# Patient Record
Sex: Female | Born: 1980 | Race: White | Hispanic: No | Marital: Married | Smoking: Never smoker
Health system: Southern US, Community
[De-identification: ages and names within clinical notes are randomized; demographics above are authoritative.]

## PROBLEM LIST (undated history)

## (undated) DIAGNOSIS — L509 Urticaria, unspecified: Secondary | ICD-10-CM

## (undated) DIAGNOSIS — R400 Somnolence: Secondary | ICD-10-CM

## (undated) DIAGNOSIS — F419 Anxiety disorder, unspecified: Secondary | ICD-10-CM

## (undated) DIAGNOSIS — F4310 Post-traumatic stress disorder, unspecified: Secondary | ICD-10-CM

## (undated) DIAGNOSIS — F329 Major depressive disorder, single episode, unspecified: Secondary | ICD-10-CM

## (undated) DIAGNOSIS — S82899A Other fracture of unspecified lower leg, initial encounter for closed fracture: Secondary | ICD-10-CM

## (undated) DIAGNOSIS — G43909 Migraine, unspecified, not intractable, without status migrainosus: Secondary | ICD-10-CM

## (undated) DIAGNOSIS — E119 Type 2 diabetes mellitus without complications: Secondary | ICD-10-CM

## (undated) DIAGNOSIS — F32A Depression, unspecified: Secondary | ICD-10-CM

## (undated) DIAGNOSIS — L309 Dermatitis, unspecified: Secondary | ICD-10-CM

## (undated) HISTORY — DX: Urticaria, unspecified: L50.9

## (undated) HISTORY — PX: CHOLECYSTECTOMY: SHX55

## (undated) HISTORY — PX: HERNIA REPAIR: SHX51

## (undated) HISTORY — PX: GANGLION CYST EXCISION: SHX1691

## (undated) HISTORY — DX: Somnolence: R40.0

---
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
## 2000-07-18 ENCOUNTER — Other Ambulatory Visit: Admission: RE | Admit: 2000-07-18 | Discharge: 2000-07-18 | Payer: Self-pay | Admitting: *Deleted

## 2001-07-27 ENCOUNTER — Other Ambulatory Visit: Admission: RE | Admit: 2001-07-27 | Discharge: 2001-07-27 | Payer: Self-pay | Admitting: Obstetrics and Gynecology

## 2002-08-31 ENCOUNTER — Other Ambulatory Visit: Admission: RE | Admit: 2002-08-31 | Discharge: 2002-08-31 | Payer: Self-pay | Admitting: Obstetrics and Gynecology

## 2005-06-19 ENCOUNTER — Emergency Department (HOSPITAL_COMMUNITY): Admission: EM | Admit: 2005-06-19 | Discharge: 2005-06-19 | Payer: Self-pay | Admitting: Emergency Medicine

## 2006-01-19 ENCOUNTER — Encounter (INDEPENDENT_AMBULATORY_CARE_PROVIDER_SITE_OTHER): Payer: Self-pay | Admitting: *Deleted

## 2006-01-19 ENCOUNTER — Observation Stay (HOSPITAL_COMMUNITY): Admission: EM | Admit: 2006-01-19 | Discharge: 2006-01-20 | Payer: Self-pay | Admitting: Emergency Medicine

## 2006-09-25 ENCOUNTER — Encounter: Admission: RE | Admit: 2006-09-25 | Discharge: 2006-09-25 | Payer: Self-pay | Admitting: Certified Nurse Midwife

## 2006-10-28 ENCOUNTER — Inpatient Hospital Stay (HOSPITAL_COMMUNITY): Admission: AD | Admit: 2006-10-28 | Discharge: 2006-10-28 | Payer: Self-pay | Admitting: Certified Nurse Midwife

## 2006-10-30 ENCOUNTER — Inpatient Hospital Stay (HOSPITAL_COMMUNITY): Admission: AD | Admit: 2006-10-30 | Discharge: 2006-10-31 | Payer: Self-pay | Admitting: Obstetrics & Gynecology

## 2006-11-25 ENCOUNTER — Inpatient Hospital Stay (HOSPITAL_COMMUNITY): Admission: AD | Admit: 2006-11-25 | Discharge: 2006-11-28 | Payer: Self-pay | Admitting: Obstetrics & Gynecology

## 2007-11-04 ENCOUNTER — Ambulatory Visit: Payer: Self-pay | Admitting: Gastroenterology

## 2007-11-04 LAB — CONVERTED CEMR LAB
ALT: 24 units/L (ref 0–35)
Albumin: 4 g/dL (ref 3.5–5.2)
Basophils Relative: 0.5 % (ref 0.0–1.0)
CO2: 25 meq/L (ref 19–32)
Calcium: 9.5 mg/dL (ref 8.4–10.5)
GFR calc non Af Amer: 108 mL/min
Glucose, Bld: 98 mg/dL (ref 70–99)
IgA: 234 mg/dL (ref 68–378)
Monocytes Relative: 6.2 % (ref 3.0–11.0)
Neutrophils Relative %: 63 % (ref 43.0–77.0)
Potassium: 4.3 meq/L (ref 3.5–5.1)
RDW: 12.5 % (ref 11.5–14.6)
TSH: 2.5 microintl units/mL (ref 0.35–5.50)
Tissue Transglutaminase Ab, IgA: 0.5 units (ref <7)
WBC: 9.1 10*3/uL (ref 4.5–10.5)

## 2008-09-14 ENCOUNTER — Emergency Department (HOSPITAL_COMMUNITY): Admission: EM | Admit: 2008-09-14 | Discharge: 2008-09-14 | Payer: Self-pay | Admitting: Emergency Medicine

## 2009-09-20 ENCOUNTER — Inpatient Hospital Stay (HOSPITAL_COMMUNITY): Admission: EM | Admit: 2009-09-20 | Discharge: 2009-09-22 | Payer: Self-pay | Admitting: Emergency Medicine

## 2010-01-12 ENCOUNTER — Inpatient Hospital Stay (HOSPITAL_COMMUNITY): Admission: AD | Admit: 2010-01-12 | Discharge: 2010-01-12 | Payer: Self-pay | Admitting: Obstetrics

## 2010-04-23 ENCOUNTER — Inpatient Hospital Stay (HOSPITAL_COMMUNITY): Admission: AD | Admit: 2010-04-23 | Discharge: 2010-04-23 | Payer: Self-pay | Admitting: Obstetrics & Gynecology

## 2010-04-23 DIAGNOSIS — O4700 False labor before 37 completed weeks of gestation, unspecified trimester: Secondary | ICD-10-CM

## 2010-05-18 ENCOUNTER — Inpatient Hospital Stay (HOSPITAL_COMMUNITY): Admission: AD | Admit: 2010-05-18 | Discharge: 2010-05-18 | Payer: Self-pay | Admitting: Obstetrics & Gynecology

## 2010-05-18 DIAGNOSIS — O4700 False labor before 37 completed weeks of gestation, unspecified trimester: Secondary | ICD-10-CM

## 2010-05-27 ENCOUNTER — Inpatient Hospital Stay (HOSPITAL_COMMUNITY): Admission: AD | Admit: 2010-05-27 | Discharge: 2010-05-27 | Payer: Self-pay | Admitting: Obstetrics & Gynecology

## 2010-06-07 ENCOUNTER — Inpatient Hospital Stay (HOSPITAL_COMMUNITY): Admission: AD | Admit: 2010-06-07 | Discharge: 2010-06-09 | Payer: Self-pay | Admitting: Obstetrics & Gynecology

## 2010-06-09 ENCOUNTER — Encounter: Admission: RE | Admit: 2010-06-09 | Discharge: 2010-06-13 | Payer: Self-pay | Admitting: Certified Nurse Midwife

## 2010-06-13 ENCOUNTER — Ambulatory Visit: Admission: RE | Admit: 2010-06-13 | Discharge: 2010-06-13 | Payer: Self-pay | Admitting: Certified Nurse Midwife

## 2010-12-06 LAB — CBC
HCT: 35.3 % — ABNORMAL LOW (ref 36.0–46.0)
Hemoglobin: 12.2 g/dL (ref 12.0–15.0)
Hemoglobin: 13.6 g/dL (ref 12.0–15.0)
MCH: 30.6 pg (ref 26.0–34.0)
MCHC: 34.5 g/dL (ref 30.0–36.0)
MCV: 87.6 fL (ref 78.0–100.0)
MCV: 88.6 fL (ref 78.0–100.0)
RBC: 3.98 MIL/uL (ref 3.87–5.11)
RDW: 13 % (ref 11.5–15.5)
WBC: 12.1 10*3/uL — ABNORMAL HIGH (ref 4.0–10.5)

## 2010-12-07 LAB — URINE MICROSCOPIC-ADD ON

## 2010-12-07 LAB — GLUCOSE, CAPILLARY: Glucose-Capillary: 107 mg/dL — ABNORMAL HIGH (ref 70–99)

## 2010-12-07 LAB — URINALYSIS, ROUTINE W REFLEX MICROSCOPIC
Bilirubin Urine: NEGATIVE
Glucose, UA: >1000 mg/dL — AB
Hgb urine dipstick: NEGATIVE
Ketones, ur: NEGATIVE mg/dL
Leukocytes, UA: NEGATIVE
Nitrite: NEGATIVE
Protein, ur: NEGATIVE mg/dL
Specific Gravity, Urine: >1.03 — ABNORMAL HIGH (ref 1.005–1.030)
Urobilinogen, UA: 0.2 mg/dL (ref 0.0–1.0)
pH: 6 (ref 5.0–8.0)

## 2010-12-07 LAB — CBC
MCV: 88.2 fL (ref 78.0–100.0)
WBC: 10.3 10*3/uL (ref 4.0–10.5)

## 2010-12-11 LAB — URINALYSIS, ROUTINE W REFLEX MICROSCOPIC
Glucose, UA: 250 mg/dL — AB
Ketones, ur: 15 mg/dL — AB
Nitrite: NEGATIVE
Protein, ur: NEGATIVE mg/dL
Specific Gravity, Urine: >1.03 — ABNORMAL HIGH (ref 1.005–1.030)
pH: 6 (ref 5.0–8.0)

## 2010-12-11 LAB — URINE MICROSCOPIC-ADD ON

## 2010-12-24 LAB — COMPREHENSIVE METABOLIC PANEL
AST: 34 U/L (ref 0–37)
Alkaline Phosphatase: 65 U/L (ref 39–117)
BUN: 9 mg/dL (ref 6–23)
CO2: 25 mEq/L (ref 19–32)
Calcium: 9.3 mg/dL (ref 8.4–10.5)
Creatinine, Ser: 0.72 mg/dL (ref 0.4–1.2)
GFR calc Af Amer: >60 mL/min (ref >60)
Glucose, Bld: 89 mg/dL (ref 70–99)
Potassium: 4.1 mEq/L (ref 3.5–5.1)
Sodium: 139 mEq/L (ref 135–145)

## 2010-12-24 LAB — URINALYSIS, ROUTINE W REFLEX MICROSCOPIC
Glucose, UA: NEGATIVE mg/dL
Hgb urine dipstick: NEGATIVE
pH: 6.5 (ref 5.0–8.0)

## 2010-12-24 LAB — DIFFERENTIAL
Basophils Absolute: 0 10*3/uL (ref 0.0–0.1)
Basophils Relative: 1 % (ref 0–1)
Eosinophils Absolute: 0.1 10*3/uL (ref 0.0–0.7)
Eosinophils Relative: 2 % (ref 0–5)
Monocytes Absolute: 0.6 10*3/uL (ref 0.1–1.0)

## 2010-12-24 LAB — POCT PREGNANCY, URINE: Preg Test, Ur: NEGATIVE

## 2010-12-24 LAB — CBC
HCT: 44 % (ref 36.0–46.0)
Platelets: 207 10*3/uL (ref 150–400)
RDW: 12.5 % (ref 11.5–15.5)
WBC: 8.9 10*3/uL (ref 4.0–10.5)

## 2011-01-16 ENCOUNTER — Ambulatory Visit (INDEPENDENT_AMBULATORY_CARE_PROVIDER_SITE_OTHER): Payer: BC Managed Care – PPO

## 2011-01-16 ENCOUNTER — Inpatient Hospital Stay (INDEPENDENT_AMBULATORY_CARE_PROVIDER_SITE_OTHER)
Admission: RE | Admit: 2011-01-16 | Discharge: 2011-01-16 | Disposition: A | Discharge status: Home / Self Care | Payer: BC Managed Care – PPO | Source: Ambulatory Visit | Attending: Family Medicine | Admitting: Family Medicine

## 2011-01-16 DIAGNOSIS — M549 Dorsalgia, unspecified: Secondary | ICD-10-CM

## 2011-01-23 ENCOUNTER — Other Ambulatory Visit: Payer: Self-pay | Admitting: Internal Medicine

## 2011-01-25 ENCOUNTER — Ambulatory Visit
Admission: RE | Admit: 2011-01-25 | Discharge: 2011-01-25 | Disposition: A | Discharge status: Home / Self Care | Payer: BC Managed Care – PPO | Source: Ambulatory Visit | Attending: Internal Medicine | Admitting: Internal Medicine

## 2011-02-05 NOTE — Assessment & Plan Note (Signed)
[facility]                         GASTROENTEROLOGY OFFICE NOTE   JOVEE, DETTINGER                    MRN:          [id number]  DATE:11/04/2007                            DOB:          Oct 18, 1980    REASON FOR CONSULTATION:  Abdominal pain, diarrhea, and hematochezia.   Ms. Calia is a 30 year old white female who is here today with her  mother.  She relates intermittent pain located at the site of an  abdominal wall hernia.  The hernia appears to be easily reducible.  She  also relates intermittent nausea, lower abdominal pain, and diarrhea  that have been problems for several years.  She has noted several  episodes of small amounts of bright red blood per rectum recently.  She  tried a high-fiber diet for the past 2 months which has improved her  symptoms.  She relates no change in stool caliber, weight loss, or  recent antibiotic usage.  She has a cousin with Crohn's disease and a  father with a history of alcoholism and cirrhosis.  No family members  with colon cancer or colon polyps.   CURRENT MEDICATIONS:  Listed on the chart, updated and reviewed.   MEDICATION ALLERGIES:  Listed on the chart, updated and reviewed.   SOCIAL HISTORY/REVIEW OF SYSTEMS:  Per the handwritten form.   PAST MEDICAL HISTORY:  Status post laparoscopic cholecystectomy with  intraoperative cholangiogram   PHYSICAL EXAMINATION:  Obese white female in no acute distress.  Height 5 feet 3-1/2 inches, weight 249 pounds, blood pressure is 126/80,  pulse 80 and regular.  HEENT EXAM:  Anicteric sclerae, oropharynx clear.  CHEST:  Clear to auscultation bilaterally.  CARDIAC:  Regular rate and rhythm without murmurs appreciated.  ABDOMEN:  Large, soft, and nondistended.  No palpable organomegaly or  masses.  She has a small umbilical hernia that is easily reducible and  is slightly tender.  NABS.  EXTREMITIES:  Without cyanosis, clubbing, or edema.  NEUROLOGIC:   Alert and oriented x3, grossly nonfocal.   ASSESSMENT AND PLAN:  1. Chronic, intermittent diarrhea, lower abdominal pain and small      volume hematochezia.  Her symptoms have improved on a high-fiber      diet.  Rule out irritable bowel syndrome, post cholecystectomy      diarrhea, inflammatory bowel disease, functional diarrhea,      hemorrhoids and other disorders.  Obtain a CBC, CMET, TSH, and      tissue transglutaminase today.  I have recommended proceeding with      a colonoscopy, but she appears very reluctant to schedule this.      Her mother states that she has been recommended to undergo      colonoscopy in the past but has declined.  At this time she is      unsure whether she will proceed, and she states she will call back      if she would like to proceed.  I have advised her to maintain her      high-fiber diet, and maintain adequate water intake on a daily      basis.  2. Small, easily reducible, umbilical hernia.  She will return to see      Dr. Fanny Skates for further management.     Pricilla Riffle. Fuller Plan, MD, [facility]  Electronically Signed    MTS/MedQ  DD: 11/10/2007  DT: 11/11/2007  Job #: [id number]   cc:   Edsel Petrin. Dalbert Batman, M.D.

## 2011-02-08 NOTE — Op Note (Signed)
NAME:  Kellie Fisher, Kellie Fisher       ACCOUNT NO.:  [id number]   MEDICAL RECORD NO.:  [id number]          PATIENT TYPE:  EMS   LOCATION:  ED                           FACILITY:  [facility]   PHYSICIAN:  Edsel Petrin. Dalbert Batman, M.D.DATE OF BIRTH:  [birth year]   DATE OF PROCEDURE:  01/19/2006  DATE OF DISCHARGE:                                 OPERATIVE REPORT   PREOP DIAGNOSIS:  Acute cholecystitis with cholelithiasis.   POSTOPERATIVE DIAGNOSIS:  Subacute cholecystitis with cholelithiasis.   OPERATION PERFORMED:  Laparoscopic cholecystectomy with intraoperative  cholangiogram.   SURGEON:  Edsel Petrin. Dalbert Batman, M.D.   ASSISTANT:  Jonne Ply, MD   OPERATIVE INDICATIONS:  This is a 30 year old morbidly obese white female.  She presents with a 1-week history of intermittent episodes of upper  abdominal pain and postprandial nausea.  When she was seen in emergency room  last night, she had had continuous pain for 24 hours.  She had mild  abdominal tenderness.  Lab work showed white blood cell count 14,400, normal  liver function test and normal lipase.  The gallbladder ultrasound showed  gallstones, slight wall thickening and a positive sonographic Murphy's sign  and normal common bile duct.  She was admitted at midnight last night and is  brought to operating room this morning for cholecystectomy.   OPERATIVE FINDINGS:  The gallbladder was a little bit thin walled and  edematous but was not severely inflamed.  There is no evidence of gangrene  or exudate.  It was slightly thick-walled however.  The liver was large  consistent with fatty infiltration.  It was quite heavy.  Otherwise the  stomach, duodenum, small intestine, large intestine and peritoneal surfaces  looked normal.   OPERATIVE TECHNIQUE:  Following induction of general endotracheal  anesthesia, the patient's abdomen was prepped and draped in sterile fashion.  Intravenous antibiotics were given in the holding area immediately  preop.  PAS hose were in place.  The patient was identified.  0.5% Marcaine with  epinephrine was used as a local infiltration anesthetic.  A vertically  oriented incision was made at the lower rim of the umbilicus.  The fascia  was incised in the midline and the abdominal cavity entered under direct  vision.  A 10 mm Hassan trocar was inserted and secured pursestring suture  of 0 Vicryl.  Pneumoperitoneum was created.  The video cam was inserted with  visualization and findings as described above.  A 10-mm trocars placed in  the subxiphoid region and two 5 mm trocars placed in the right upper  quadrant.  The gallbladder was tense and was evacuated with suction trocar.  We dissected out the cystic duct and cystic artery.  We created a large  window behind the cystic duct.  Metal clip was placed on the cystic duct  close to the gallbladder.  Cholangiogram catheter was inserted into the  cystic duct.  A cholangiogram was obtained using the C-arm.  The  cholangiogram showed normal intrahepatic and extrahepatic bile duct anatomy,  no filling defects, and no obstruction with good flow of contrast into the  duodenum.  The cholangiogram catheter was removed and the  cystic duct was  secured with multiple metal clips and divided.  Cystic artery was then  isolated as it went onto the wall of the gallbladder, was secured with  multiple metal clips and divided.  Gallbladder dissected from its bed with  electrocautery, placed in the specimen bag and removed.  The operative field  was copiously irrigated with saline.  At the completion of the case,  irrigation fluid was completely clear.  There is no bleeding or bile leak  whatsoever.  The trocars were removed under direct vision.  There is no  bleeding from trocar sites.  Pneumoperitoneum was released.  The fascia at  the umbilicus closed with 0 Vicryl  sutures.  The skin incision closed subcuticular sutures of 4-0 Monocryl and  Steri-Strips.  Clean  bandages were placed.  The patient taken to recovery in  stable condition.  Estimated blood loss about 10 mL.  Complications none.  Sponge, needle and instrument counts were correct.      Edsel Petrin. Dalbert Batman, M.D.  Electronically Signed     HMI/MEDQ  D:  01/19/2006  T:  01/20/2006  Job:  [id number]   cc:   [facility], [address]

## 2011-02-08 NOTE — H&P (Signed)
NAME:  Kellie Fisher, Kellie Fisher       ACCOUNT NO.:  [id number]   MEDICAL RECORD NO.:  [id number]          PATIENT TYPE:  EMS   LOCATION:  ED                           FACILITY:  [facility]   PHYSICIAN:  Edsel Petrin. Dalbert Batman, M.D.DATE OF BIRTH:  [birth year]   DATE OF ADMISSION:  01/18/2006  DATE OF DISCHARGE:                                HISTORY & PHYSICAL   CHIEF COMPLAINT:  Abdominal pain and nausea.   HISTORY OF PRESENT ILLNESS:  This is a 30 year old white female who reports  a one-week history of intermittent episodes of upper abdominal pain and  postprandial nausea.  The pain has been worse and has been continuous for  the past 24 hours.  She states that she went to [facility] today  and was referred to the [facility] Emergency Room.  When she was at [facility], her initial blood pressure was 88/40 but after receiving some  IV fluids, it has been normal.  The patient was evaluated in the [facility]  Emergency Room by Dr. Peter Minium and after a thorough work-up, I was asked to  see the patient.  I was called to see the patient at 10:30 p.m. She has been  stable but still has pain despite the use of intravenous Dilaudid.   PAST MEDICAL HISTORY:  1.  Surgery for the left wrist ganglion.  2.  Excision of lipomas of the extremities.  3.  She has had fractures of her left wrist, fractures of her left elbow and      fractures of her right ankle.  4.  She has an anxiety disorder.  5.  She has obesity.   CURRENT MEDICATIONS:  1.  Xanax p.r.n.  2.  Lexapro daily.  3.  Zantac p.r.n.   ALLERGIES:  CLINDAMYCIN, BENZOIN, PEROXIDE and perhaps some other ACNE  MEDICATIONS.   SOCIAL HISTORY:  The patient is married but may be a estranged from her  husband.  She is here with a female partner who is not her husband.  She has  never been pregnant.  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  Denies the use of tobacco.  Drinks alcohol rarely.   FAMILY HISTORY:   Mother living, has obesity and lung disease.  Father  deceased. Had alcoholic liver disease.  The patient is an only child.  There  is polycystic kidney disease and diabetes in the family.   REVIEW OF SYSTEMS:  All 15-system review of systems is performed.  It is  noncontributory except as described above.   PHYSICAL EXAMINATION:  GENERAL:  Obese, white female in minimal distress.  She is slightly sedated from the Dilaudid, but is cooperative.  VITAL SIGNS:  Temperature 97.1, pulse 91, respirations 22, blood pressure  112/77.  HEENT:  Eyes:  Sclerae clear.  Extraocular movements were intact.  Ears,  nose, mouth, and throat, nasopharynx and oropharynx without gross lesions.  NECK:  Supple, nontender.  No mass.  No jugular venous distention.  LUNGS:  Clear to auscultation.  No chest wall tenderness.  BREASTS:  Not examined.  HEART:  Regular rate and rhythm.  Radial and femoral pulses are palpable.  Posterior tibial pulses are palpable.  No peripheral edema.  ABDOMEN:  Obese, soft, not distended.  Liver and spleen not enlarged.  She  does have some mild tenderness across the entire upper abdomen, perhaps a  little bit more in the right upper quadrant than elsewhere, but no mass and  no significant guarding.  I see no scars and I feel no hernias.  EXTREMITIES:  She moves all four extremities well without pain or deformity.  NEUROLOGIC:  No gross motor or sensory deficits.   ADMISSION DATA:  Ultrasound shows gallstones, borderline thickening of the  gallbladder wall and a positive sonographic Murphy's sign.  A common bile  duct measured 3.4 mm and is normal caliber.  Lab work reveals a hemoglobin  of 15, white blood cell count of 14,400.  Urinalysis is clear.  Liver  function tests normal.  Lipase 25. Basic metabolic panel normal.  Urine  pregnancy test negative.   ASSESSMENT:  1.  Acute cholecystitis with cholelithiasis.  2.  Obesity.  3.  Anxiety disorder.   PLAN:  1.  The patient  will be admitted and placed on analgesics and intravenous      antibiotics.  2.  We will plan laparoscopic cholecystectomy with cholangiogram tomorrow      morning.  3.  I have discussed the indications and details of surgery with the patient      and her female partner friend.  Risks and complications have been outlined      including but not limited to bleeding, infection, conversion to open      laparotomy, injury to adjacent organs such as the common bile duct, or      intestine with major reconstructive surgery, wound problems such as      infection or hernia, cardiac, pulmonary and thromboembolic problems. She      seems to understand these issues well.  At this time all her questions      were answered.  She is in full agreement with this plan.      Edsel Petrin. Dalbert Batman, M.D.  Electronically Signed     HMI/MEDQ  D:  01/18/2006  T:  01/19/2006  Job:  [id number]   cc:   [facility]

## 2011-03-04 ENCOUNTER — Emergency Department (HOSPITAL_COMMUNITY): Payer: BC Managed Care – PPO

## 2011-03-04 ENCOUNTER — Emergency Department (HOSPITAL_COMMUNITY)
Admission: EM | Admit: 2011-03-04 | Discharge: 2011-03-04 | Disposition: A | Discharge status: Home / Self Care | Payer: BC Managed Care – PPO | Attending: Emergency Medicine | Admitting: Emergency Medicine

## 2011-03-04 DIAGNOSIS — J40 Bronchitis, not specified as acute or chronic: Secondary | ICD-10-CM | POA: Insufficient documentation

## 2011-03-04 DIAGNOSIS — R000 Tachycardia, unspecified: Secondary | ICD-10-CM | POA: Insufficient documentation

## 2011-03-04 DIAGNOSIS — R062 Wheezing: Secondary | ICD-10-CM | POA: Insufficient documentation

## 2011-03-04 DIAGNOSIS — R05 Cough: Secondary | ICD-10-CM | POA: Insufficient documentation

## 2011-03-04 DIAGNOSIS — R0602 Shortness of breath: Secondary | ICD-10-CM | POA: Insufficient documentation

## 2011-03-04 DIAGNOSIS — R197 Diarrhea, unspecified: Secondary | ICD-10-CM | POA: Insufficient documentation

## 2011-03-04 DIAGNOSIS — R059 Cough, unspecified: Secondary | ICD-10-CM | POA: Insufficient documentation

## 2011-03-04 LAB — POCT I-STAT, CHEM 8
BUN: 5 mg/dL — ABNORMAL LOW (ref 6–23)
Creatinine, Ser: 0.8 mg/dL (ref 0.4–1.2)
Potassium: 3.4 mEq/L — ABNORMAL LOW (ref 3.5–5.1)
Sodium: 140 mEq/L (ref 135–145)

## 2011-06-28 LAB — URINALYSIS, ROUTINE W REFLEX MICROSCOPIC
Ketones, ur: NEGATIVE mg/dL
Nitrite: NEGATIVE
Specific Gravity, Urine: 1.017 (ref 1.005–1.030)
Urobilinogen, UA: 0.2 mg/dL (ref 0.0–1.0)
pH: 5.5 (ref 5.0–8.0)

## 2011-06-28 LAB — COMPREHENSIVE METABOLIC PANEL
ALT: 21 U/L (ref 0–35)
AST: 27 U/L (ref 0–37)
Albumin: 3.7 g/dL (ref 3.5–5.2)
Alkaline Phosphatase: 64 U/L (ref 39–117)
GFR calc Af Amer: >60 mL/min (ref >60)
Glucose, Bld: 91 mg/dL (ref 70–99)
Potassium: 4.8 mEq/L (ref 3.5–5.1)
Sodium: 140 mEq/L (ref 135–145)
Total Protein: 6.9 g/dL (ref 6.0–8.3)

## 2011-06-28 LAB — DIFFERENTIAL
Basophils Relative: 1 % (ref 0–1)
Eosinophils Absolute: 0.2 10*3/uL (ref 0.0–0.7)
Eosinophils Relative: 2 % (ref 0–5)
Lymphs Abs: 2.1 10*3/uL (ref 0.7–4.0)
Monocytes Absolute: 0.5 10*3/uL (ref 0.1–1.0)
Monocytes Relative: 5 % (ref 3–12)
Neutrophils Relative %: 69 % (ref 43–77)

## 2011-06-28 LAB — CBC
Hemoglobin: 14.9 g/dL (ref 12.0–15.0)
RDW: 13.1 % (ref 11.5–15.5)

## 2011-06-28 LAB — PREGNANCY, URINE: Preg Test, Ur: NEGATIVE

## 2012-03-21 ENCOUNTER — Encounter (HOSPITAL_COMMUNITY): Payer: Self-pay

## 2012-03-21 ENCOUNTER — Emergency Department (HOSPITAL_COMMUNITY): Payer: BC Managed Care – PPO

## 2012-03-21 ENCOUNTER — Emergency Department (HOSPITAL_COMMUNITY)
Admission: EM | Admit: 2012-03-21 | Discharge: 2012-03-21 | Disposition: A | Discharge status: Home / Self Care | Payer: BC Managed Care – PPO | Attending: Emergency Medicine | Admitting: Emergency Medicine

## 2012-03-21 DIAGNOSIS — Z79899 Other long term (current) drug therapy: Secondary | ICD-10-CM | POA: Insufficient documentation

## 2012-03-21 DIAGNOSIS — X500XXA Overexertion from strenuous movement or load, initial encounter: Secondary | ICD-10-CM | POA: Insufficient documentation

## 2012-03-21 DIAGNOSIS — S93409A Sprain of unspecified ligament of unspecified ankle, initial encounter: Secondary | ICD-10-CM | POA: Insufficient documentation

## 2012-03-21 HISTORY — DX: Anxiety disorder, unspecified: F41.9

## 2012-03-21 HISTORY — DX: Depression, unspecified: F32.A

## 2012-03-21 HISTORY — DX: Major depressive disorder, single episode, unspecified: F32.9

## 2012-03-21 HISTORY — DX: Other fracture of unspecified lower leg, initial encounter for closed fracture: S82.899A

## 2012-03-21 HISTORY — DX: Other disorders of calcium metabolism: E83.59

## 2012-03-21 MED ORDER — HYDROCODONE-ACETAMINOPHEN 5-500 MG PO TABS: 1.0000 | ORAL_TABLET | Freq: Four times a day (QID) | ORAL | Status: AC | PRN

## 2012-03-21 NOTE — ED Provider Notes (Signed)
Medical screening examination/treatment/procedure(s) were performed by non-physician practitioner and as supervising physician I was immediately available for consultation/collaboration.   Kathalene Frames, MD 03/21/12 2113

## 2012-03-21 NOTE — ED Provider Notes (Signed)
History     CSN: [id number]  Arrival date & time 03/21/12  7209   First MD Initiated Contact with Patient 03/21/12 1952      Chief Complaint  Patient presents with  . Ankle Pain    (Consider location/radiation/quality/duration/timing/severity/associated sxs/prior treatment) HPI  Pt presents to the ED with complaints of right ankle pain. She admits to walking yesterday around 2 PM and accidentally rolling her ankle. She does admit to having previous injury to this ankle when she was 12. She is able to angulate with a limp. Has been using an Ace bandage, ice, elevation and ibuprofen. She states she feels the swelling has increased as well as the pain. She is in no acute distress. Patient refuses to use a wheelchair, walker or crutches.  Past Medical History  Diagnosis Date  . Ankle fracture   . Tumor containing calcium   . Depression   . Anxiety     Past Surgical History  Procedure Date  . Cholecystectomy   . Hernia repair     History reviewed. No pertinent family history.  History  Substance Use Topics  . Smoking status: Never Smoker   . Smokeless tobacco: Not on file  . Alcohol Use: Yes     occasional    OB History    Grav Para Term Preterm Abortions TAB SAB Ect Mult Living                  Review of Systems   HEENT: denies blurry vision or change in hearing PULMONARY: Denies difficulty breathing and SOB CARDIAC: denies chest pain or heart palpitations MUSCULOSKELETAL:  denies being unable to ambulate ABDOMEN AL: denies abdominal pain GU: denies loss of bowel or urinary control NEURO: denies numbness and tingling in extremities SKIN: no new rashes      Allergies  Azithromycin  Home Medications   Current Outpatient Rx  Name Route Sig Dispense Refill  . ALPRAZOLAM 0.25 MG PO TABS Oral Take 1 mg by mouth daily.    . BUPROPION HCL ER (XL) 150 MG PO TB24 Oral Take 150 mg by mouth daily.    Marland Kitchen CITALOPRAM HYDROBROMIDE 40 MG PO TABS Oral Take 40 mg by  mouth daily.    Marland Kitchen LORATADINE 10 MG PO TABS Oral Take 10 mg by mouth at bedtime.    Marland Kitchen HYDROCODONE-ACETAMINOPHEN 5-500 MG PO TABS Oral Take 1 tablet by mouth every 6 (six) hours as needed for pain. 10 tablet 0    BP 134/96  Pulse 84  Temp 98.6 F (37 C) (Oral)  Resp 18  Ht 5' 3"  (1.6 m)  Wt 260 lb (117.935 kg)  BMI 46.06 kg/m2  SpO2 98%  LMP 03/15/2012  Physical Exam  Nursing note and vitals reviewed. Constitutional: She appears well-developed and well-nourished. No distress.  HENT:  Head: Normocephalic and atraumatic.  Eyes: Pupils are equal, round, and reactive to light.  Neck: Normal range of motion. Neck supple.  Cardiovascular: Normal rate and regular rhythm.   Pulmonary/Chest: Effort normal.  Abdominal: Soft.  Musculoskeletal:       Right foot: She exhibits decreased range of motion, tenderness and swelling. She exhibits no bony tenderness, normal capillary refill, no crepitus, no deformity and no laceration.  Neurological: She is alert.  Skin: Skin is warm and dry.    ED Course  Procedures (including critical care time)  Labs Reviewed - No data to display Dg Ankle Complete Right  03/21/2012  *RADIOLOGY REPORT*  Clinical Data: Rolled right  ankle 1 day, pain, remote childhood fracture  RIGHT ANKLE - COMPLETE 3+ VIEW  Comparison: None.  Findings: Old ossicle at lateral malleolus. Mild lateral soft tissue swelling. Ankle mortise intact. Osseous mineralization normal. Additional old rounded density at tip of medial malleolus. No definite acute fracture, dislocation, or bone destruction.  IMPRESSION: No acute osseous abnormalities.  Original Report Authenticated By: Burnetta Sabin, M.D.     1. Ankle sprain       MDM  Patient refuses crutches. Given ASO splint in the ER as well as a prescription for pain medicine and a referral to Dr. Berenice Primas the fourth toe for followup.  Pt has been advised of the symptoms that warrant their return to the ED. Patient has voiced  understanding and has agreed to follow-up with the PCP or specialist.         Linus Mako, [place] 03/21/12 2105

## 2012-03-21 NOTE — Discharge Instructions (Signed)
Ankle Sprain An ankle sprain is an injury to the strong, fibrous tissues (ligaments) that hold the bones of your ankle joint together.  CAUSES Ankle sprain usually is caused by a fall or by twisting your ankle. People who participate in sports are more prone to these types of injuries.  SYMPTOMS  Symptoms of ankle sprain include:  Pain in your ankle. The pain may be present at rest or only when you are trying to stand or walk.   Swelling.   Bruising. Bruising may develop immediately or within 1 to 2 days after your injury.   Difficulty standing or walking.  DIAGNOSIS  Your caregiver will ask you details about your injury and perform a physical exam of your ankle to determine if you have an ankle sprain. During the physical exam, your caregiver will press and squeeze specific areas of your foot and ankle. Your caregiver will try to move your ankle in certain ways. An X-ray exam may be done to be sure a bone was not broken or a ligament did not separate from one of the bones in your ankle (avulsion).  TREATMENT  Certain types of braces can help stabilize your ankle. Your caregiver can make a recommendation for this. Your caregiver may recommend the use of medication for pain. If your sprain is severe, your caregiver may refer you to a surgeon who helps to restore function to parts of your skeletal system (orthopedist) or a physical therapist. HOME CARE INSTRUCTIONS  Apply ice to your injury for 1 to 2 days or as directed by your caregiver. Applying ice helps to reduce inflammation and pain.  Put ice in a plastic bag.   Place a towel between your skin and the bag.   Leave the ice on for 15 to 20 minutes at a time, every 2 hours while you are awake.   Take over-the-counter or prescription medicines for pain, discomfort, or fever only as directed by your caregiver.   Keep your injured leg elevated, when possible, to lessen swelling.   If your caregiver recommends crutches, use them as  instructed. Gradually, put weight on the affected ankle. Continue to use crutches or a cane until you can walk without feeling pain in your ankle.   If you have a plaster splint, wear the splint as directed by your caregiver. Do not rest it on anything harder than a pillow the first 24 hours. Do not put weight on it. Do not get it wet. You may take it off to take a shower or bath.   You may have been given an elastic bandage to wear around your ankle to provide support. If the elastic bandage is too tight (you have numbness or tingling in your foot or your foot becomes cold and blue), adjust the bandage to make it comfortable.   If you have an air splint, you may blow more air into it or let air out to make it more comfortable. You may take your splint off at night and before taking a shower or bath.   Wiggle your toes in the splint several times per day if you are able.  SEEK MEDICAL CARE IF:   You have an increase in bruising, swelling, or pain.   Your toes feel cold.   Pain relief is not achieved with medication.  SEEK IMMEDIATE MEDICAL CARE IF: Your toes are numb or blue or you have severe pain. MAKE SURE YOU:   Understand these instructions.   Will watch your condition.     Will get help right away if you are not doing well or get worse.  Document Released: 09/09/2005 Document Revised: 08/29/2011 Document Reviewed: 04/13/2008 [employer] Patient Information 2012 [employer]. 

## 2012-03-21 NOTE — ED Notes (Signed)
Per pt, she was walking yesterday around 2pm and rolled right ankle.  Has had previous injury to the ankle when she was 12 with casting.  Pt states pain to rt ankle with difficulty walking since yesterday.  Ace bandage, ice, elevation, ibuprofen not working to decrease pain and swelling.  Rt ankle is swollen.

## 2012-03-31 ENCOUNTER — Emergency Department (HOSPITAL_COMMUNITY)
Admission: EM | Admit: 2012-03-31 | Discharge: 2012-04-01 | Disposition: A | Discharge status: Home / Self Care | Payer: BC Managed Care – PPO | Attending: Emergency Medicine | Admitting: Emergency Medicine

## 2012-03-31 ENCOUNTER — Encounter (HOSPITAL_COMMUNITY): Payer: Self-pay | Admitting: *Deleted

## 2012-03-31 DIAGNOSIS — T50992A Poisoning by other drugs, medicaments and biological substances, intentional self-harm, initial encounter: Secondary | ICD-10-CM | POA: Insufficient documentation

## 2012-03-31 DIAGNOSIS — T503X4A Poisoning by electrolytic, caloric and water-balance agents, undetermined, initial encounter: Secondary | ICD-10-CM | POA: Insufficient documentation

## 2012-03-31 DIAGNOSIS — T40601A Poisoning by unspecified narcotics, accidental (unintentional), initial encounter: Secondary | ICD-10-CM | POA: Insufficient documentation

## 2012-03-31 DIAGNOSIS — F4310 Post-traumatic stress disorder, unspecified: Secondary | ICD-10-CM | POA: Insufficient documentation

## 2012-03-31 DIAGNOSIS — T50901A Poisoning by unspecified drugs, medicaments and biological substances, accidental (unintentional), initial encounter: Secondary | ICD-10-CM

## 2012-03-31 DIAGNOSIS — F411 Generalized anxiety disorder: Secondary | ICD-10-CM | POA: Insufficient documentation

## 2012-03-31 DIAGNOSIS — T394X2A Poisoning by antirheumatics, not elsewhere classified, intentional self-harm, initial encounter: Secondary | ICD-10-CM | POA: Insufficient documentation

## 2012-03-31 HISTORY — DX: Post-traumatic stress disorder, unspecified: F43.10

## 2012-03-31 LAB — COMPREHENSIVE METABOLIC PANEL
ALT: 28 U/L (ref 0–35)
AST: 20 U/L (ref 0–37)
CO2: 23 mEq/L (ref 19–32)
Calcium: 9.1 mg/dL (ref 8.4–10.5)
Creatinine, Ser: 0.69 mg/dL (ref 0.50–1.10)
GFR calc Af Amer: >90 mL/min (ref >90)
GFR calc non Af Amer: >90 mL/min (ref >90)
Sodium: 136 mEq/L (ref 135–145)
Total Protein: 7.2 g/dL (ref 6.0–8.3)

## 2012-03-31 LAB — CBC WITH DIFFERENTIAL/PLATELET
Basophils Absolute: 0 10*3/uL (ref 0.0–0.1)
Eosinophils Absolute: 0.1 10*3/uL (ref 0.0–0.7)
Eosinophils Relative: 1 % (ref 0–5)
HCT: 42.8 % (ref 36.0–46.0)
MCH: 28.5 pg (ref 26.0–34.0)
MCHC: 32.9 g/dL (ref 30.0–36.0)
MCV: 86.5 fL (ref 78.0–100.0)
Monocytes Absolute: 0.3 10*3/uL (ref 0.1–1.0)
Platelets: 233 10*3/uL (ref 150–400)
RDW: 12.5 % (ref 11.5–15.5)

## 2012-03-31 LAB — POCT I-STAT, CHEM 8
BUN: 6 mg/dL (ref 6–23)
Chloride: 108 mEq/L (ref 96–112)
Creatinine, Ser: 0.7 mg/dL (ref 0.50–1.10)
Potassium: 3.9 mEq/L (ref 3.5–5.1)
Sodium: 141 mEq/L (ref 135–145)

## 2012-03-31 LAB — BASIC METABOLIC PANEL
BUN: 6 mg/dL (ref 6–23)
CO2: 21 mEq/L (ref 19–32)
Calcium: 8.5 mg/dL (ref 8.4–10.5)
Chloride: 104 mEq/L (ref 96–112)
Creatinine, Ser: 0.69 mg/dL (ref 0.50–1.10)
GFR calc Af Amer: >90 mL/min (ref >90)
GFR calc non Af Amer: >90 mL/min (ref >90)
Glucose, Bld: 91 mg/dL (ref 70–99)
Potassium: 3.7 mEq/L (ref 3.5–5.1)
Sodium: 135 mEq/L (ref 135–145)

## 2012-03-31 LAB — RAPID URINE DRUG SCREEN, HOSP PERFORMED
Amphetamines: NOT DETECTED
Barbiturates: NOT DETECTED
Benzodiazepines: POSITIVE — AB
Cocaine: NOT DETECTED

## 2012-03-31 LAB — ACETAMINOPHEN LEVEL: Acetaminophen (Tylenol), Serum: <15 ug/mL (ref 10–30)

## 2012-03-31 NOTE — ED Notes (Signed)
Pt's husband has to go home and care for kids.  His cell # [phone].  He'd like to be called w/update.

## 2012-03-31 NOTE — ED Notes (Addendum)
Pt states "I've been dx'd with depression since I was about 31 yrs old, my husband told me I had to come in or he will go get papers for 3 days, I took about 10-12 vicodin and probably 20 potassium pills"

## 2012-03-31 NOTE — ED Provider Notes (Signed)
History     CSN: [id number]  Arrival date & time 03/31/12  1610   First MD Initiated Contact with Patient 03/31/12 1655      Chief Complaint  Patient presents with  . V70.1    (Consider location/radiation/quality/duration/timing/severity/associated sxs/prior treatment) HPI Comments: Patient presents after intentional ingestion of Vicodin and over-the-counter potassium. She is uncertain of the strength of potassium. She ingested about 20 potassium pills and 10 Vicodin and a suicide attempt about 2 hours ago. She denies any ethanol or illicit drug use. She denies any headache, chest pain, shortness of breath, nausea vomiting or abdominal pain. She denies any homicidal ideation or hallucinations  The history is provided by the patient.    Past Medical History  Diagnosis Date  . Ankle fracture   . Tumor containing calcium   . Depression   . Anxiety   . PTSD (post-traumatic stress disorder)     Past Surgical History  Procedure Date  . Cholecystectomy   . Hernia repair     No family history on file.  History  Substance Use Topics  . Smoking status: Never Smoker   . Smokeless tobacco: Not on file  . Alcohol Use: Yes     occasional    OB History    Grav Para Term Preterm Abortions TAB SAB Ect Mult Living                  Review of Systems  Constitutional: Negative for fever, activity change and appetite change.  HENT: Negative for congestion and rhinorrhea.   Respiratory: Negative for cough, chest tightness and shortness of breath.   Cardiovascular: Negative for chest pain.  Gastrointestinal: Negative for nausea, vomiting and abdominal pain.  Genitourinary: Negative for dysuria and hematuria.  Musculoskeletal: Negative for back pain.  Skin: Negative for rash.  Neurological: Negative for dizziness, weakness, light-headedness and numbness.  Psychiatric/Behavioral: Positive for disturbed wake/sleep cycle, self-injury and agitation. Negative for confusion.     Allergies  Azithromycin  Home Medications   Current Outpatient Rx  Name Route Sig Dispense Refill  . ACETAMINOPHEN 500 MG PO TABS Oral Take 1,000 mg by mouth every 6 (six) hours as needed. For pain.    Marland Kitchen ALPRAZOLAM 0.25 MG PO TABS Oral Take 1 mg by mouth 3 (three) times daily as needed. For anxiety.    . BUPROPION HCL ER (XL) 150 MG PO TB24 Oral Take 150 mg by mouth daily.    Marland Kitchen CITALOPRAM HYDROBROMIDE 40 MG PO TABS Oral Take 40 mg by mouth daily.    Marland Kitchen HYDROCODONE-ACETAMINOPHEN 5-500 MG PO TABS Oral Take 1 tablet by mouth every 6 (six) hours as needed for pain. 10 tablet 0  . IBUPROFEN 200 MG PO TABS Oral Take 800 mg by mouth every 8 (eight) hours as needed. For pain.    Marland Kitchen LORATADINE 10 MG PO TABS Oral Take 10 mg by mouth at bedtime.      BP 104/63  Pulse 71  Temp 97.9 F (36.6 C) (Oral)  Resp 14  Wt 260 lb (117.935 kg)  SpO2 96%  LMP 03/15/2012  Physical Exam  Constitutional: She is oriented to person, place, and time. She appears well-developed and well-nourished. No distress.  HENT:  Head: Normocephalic and atraumatic.  Mouth/Throat: Oropharynx is clear and moist. No oropharyngeal exudate.  Eyes: Conjunctivae and EOM are normal. Pupils are equal, round, and reactive to light.  Neck: Normal range of motion. Neck supple.  Cardiovascular: Normal rate, regular rhythm and normal heart  sounds.   No murmur heard. Pulmonary/Chest: Effort normal and breath sounds normal. No respiratory distress.  Abdominal: Soft. There is no tenderness. There is no rebound and no guarding.  Musculoskeletal: Normal range of motion. She exhibits no edema.  Neurological: She is alert and oriented to person, place, and time. No cranial nerve deficit.  Skin: Skin is warm.    ED Course  Procedures (including critical care time)  Labs Reviewed  COMPREHENSIVE METABOLIC PANEL - Abnormal; Notable for the following:    Glucose, Bld 155 (*)     All other components within normal limits  URINE RAPID  DRUG SCREEN (HOSP PERFORMED) - Abnormal; Notable for the following:    Benzodiazepines POSITIVE (*)     All other components within normal limits  SALICYLATE LEVEL - Abnormal; Notable for the following:    Salicylate Lvl <8.2 (*)     All other components within normal limits  POCT I-STAT, CHEM 8 - Abnormal; Notable for the following:    Glucose, Bld 135 (*)     All other components within normal limits  CBC WITH DIFFERENTIAL  ACETAMINOPHEN LEVEL  ETHANOL  POCT PREGNANCY, URINE  BASIC METABOLIC PANEL   No results found.   1. Overdose       MDM  Intentional overdose on potassium, Vicodin. Bowel stable, no distress, no hyperkalemic changes an EKG.  Discussed with Joy at the [facility]. Without EKG changes she recommend supportive care, electrolytes monitoring and fluid resuscitation as needed.  Labs reviewed. Repeat potassium stable.  Medically clear for psychiatric evaluation.    Date: 03/31/2012  Rate: 83  Rhythm: normal sinus rhythm  QRS Axis: normal  Intervals: normal  ST/T Wave abnormalities: normal  Conduction Disutrbances:none  Narrative Interpretation:   Old EKG Reviewed: none available    Ezequiel Essex, MD 03/31/12 2358
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
## 2012-03-31 NOTE — ED Notes (Signed)
Kellie Fisher from [employer] has called for an update.  She recommends a 2nd electrolyte level 3 hrs after the first one was drawn.

## 2012-03-31 NOTE — ED Notes (Signed)
[id number]> Expected date:<BR> Expected time:<BR> Means of arrival:<BR> Comments:<BR>

## 2012-04-01 DIAGNOSIS — F411 Generalized anxiety disorder: Secondary | ICD-10-CM

## 2012-04-01 DIAGNOSIS — F4310 Post-traumatic stress disorder, unspecified: Secondary | ICD-10-CM

## 2012-04-01 DIAGNOSIS — T40601A Poisoning by unspecified narcotics, accidental (unintentional), initial encounter: Secondary | ICD-10-CM

## 2012-04-01 DIAGNOSIS — T503X4A Poisoning by electrolytic, caloric and water-balance agents, undetermined, initial encounter: Secondary | ICD-10-CM

## 2012-04-01 NOTE — ED Notes (Addendum)
Blanch Media from [facility] has called and been updated. She will close the case and says to call back if needed.

## 2012-04-01 NOTE — ED Provider Notes (Signed)
Pt assessed by Dr Lenna Sciara.  Priscille Loveless for discharge.  Outpt referrals given.  Kathalene Frames, MD 04/01/12 [phone]

## 2012-04-01 NOTE — Progress Notes (Signed)
Pt has been evaluated by the unit psychiatrist who feels the pt is stable for discharge. At psychiatrist's request, CSW met with pt to complete a no harm contract and provide outpatient referrals to [facility], mobile crisis and provided a list of local psychiatrists and therapists. At pt's request, CSW also provided the pt with information on services for her developmental disabled son. No further needs identified by the pt at this time.
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
## 2012-04-01 NOTE — ED Provider Notes (Signed)
0745 Patient sleeping on AM rounds.  Per RN, no overnight events or complaints. Patient s/p OD w vicodin and K.  BH eval in progress.   Carmin Muskrat, MD 04/01/12 [phone]

## 2013-07-11 ENCOUNTER — Encounter (HOSPITAL_COMMUNITY): Payer: Self-pay | Admitting: Emergency Medicine

## 2013-07-11 ENCOUNTER — Emergency Department (HOSPITAL_COMMUNITY)
Admission: EM | Admit: 2013-07-11 | Discharge: 2013-07-11 | Disposition: A | Discharge status: Home / Self Care | Payer: Medicaid Other | Source: Home / Self Care | Attending: Emergency Medicine | Admitting: Emergency Medicine

## 2013-07-11 DIAGNOSIS — L301 Dyshidrosis [pompholyx]: Secondary | ICD-10-CM

## 2013-07-11 MED ORDER — BETAMETHASONE DIPROPIONATE AUG 0.05 % EX CREA: TOPICAL_CREAM | Freq: Two times a day (BID) | CUTANEOUS | Status: DC

## 2013-07-11 MED ORDER — PREDNISONE 20 MG PO TABS: ORAL_TABLET | ORAL | Status: DC

## 2013-07-11 MED ORDER — METHYLPREDNISOLONE ACETATE 80 MG/ML IJ SUSP
INTRAMUSCULAR | Status: AC
  Filled 2013-07-11: qty 1

## 2013-07-11 MED ORDER — METHYLPREDNISOLONE ACETATE 80 MG/ML IJ SUSP
80.0000 mg | Freq: Once | INTRAMUSCULAR | Status: AC
  Administered 2013-07-11: 80 mg via INTRAMUSCULAR

## 2013-07-11 NOTE — ED Notes (Signed)
Assessment performed per Dr. Jake Michaelis.

## 2013-07-11 NOTE — ED Notes (Signed)
Patient called with concerns for increasing blisters to nose.  Encouraged use of cream to face per dr Jake Michaelis, patient unable to get cream today due to preauthorization

## 2013-07-11 NOTE — ED Notes (Signed)
Dr Jake Michaelis changed cream to triamcinolone cream 0.1% 85.2 gram tube, no refills, applied 3 times a day.  Called in to [facility] [phone].  Patient aware of medicine change and new script called in to pharmacy

## 2013-07-11 NOTE — ED Provider Notes (Signed)
Chief Complaint:   Chief Complaint  Patient presents with  . Allergic Reaction    History of Present Illness:   Kellie Fisher is a 32 year old female who has had a 4 year history of recurring episodes of a rash on her handouts. This starts out with swollen, painful bumps in the size of her fingers. The bumps turn into vesicles, then can spread to the palms of the hand, up to the wrists and forearms, and even sometimes the feet. Current episode this began a day or 2 ago. There was no obvious precipitating factor. The patient states that these occur about 3 or 4 times a year. She's not been able to identify anything that seems to bring them on. She's had no new soaps, detergents, fabric softener, or dryer sheets. No new food or clothing exposure to plants or animals. No new foods or medications. She's never been to see a dermatologist for this before. These usually respond well to a corticosteroid injection.  Review of Systems:  Other than noted above, the patient denies any of the following symptoms: Systemic:  No fever, chills, sweats, weight loss, or fatigue. ENT:  No nasal congestion, rhinorrhea, sore throat, swelling of lips, tongue or throat. Resp:  No cough, wheezing, or shortness of breath. Skin:  No rash, itching, nodules, or suspicious lesions.  [place]:  Past medical history, family history, social history, meds, and allergies were reviewed. She's allergic to azithromycin and benzoyl peroxide. She has a NuvaRing.  Physical Exam:   Vital signs:  BP 140/85  Pulse 103  Temp(Src) 98.3 F (36.8 C) (Oral)  Resp 22  SpO2 97%  LMP 07/11/2013 Gen:  Alert, oriented, in no distress. ENT:  Pharynx clear, no intraoral lesions, moist mucous membranes. Lungs:  Clear to auscultation. Skin:  She has erythematous papules and vesicles on the sides of her fingers, Paxil her fingers, palms of her hand, and a few on her wrist. There were none forearms her upper arms. She has a number of excoriated  areas on both lower legs. Skin was otherwise clear.  Course in Urgent Care Center:   Given Depo-Medrol 80 mg IM.  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  I'm not certain as to what the precipitating factor is. She will need to see dermatology.  Plan:   1.  Meds:  The following meds were prescribed:   Discharge Medication List as of 07/11/2013  1:16 PM    START taking these medications   Details  augmented betamethasone dipropionate (DIPROLENE AF) 0.05 % cream Apply topically 2 (two) times daily., Starting 07/11/2013, Until Discontinued, Normal    predniSONE (DELTASONE) 20 MG tablet 3 daily for 4 days, 2 daily for 4 days, 1 daily for 4 days., Normal        2.  Patient Education/Counseling:  The patient was given appropriate handouts, self care instructions, and instructed in symptomatic relief.  Patient is to avoid strong detergents or soaps. She may use occlusion at bedtime for the Diprolene cream.  3.  Follow up:  The patient was told to follow up if no better in 3 to 4 days, if becoming worse in any way, and given some red flag symptoms such as worsening rash which would prompt immediate return.  Follow up with Dr. Nena Polio as soon as possible.      Harden Mo, MD 07/11/13 [phone]

## 2013-09-20 ENCOUNTER — Emergency Department (HOSPITAL_BASED_OUTPATIENT_CLINIC_OR_DEPARTMENT_OTHER)
Admission: EM | Admit: 2013-09-20 | Discharge: 2013-09-20 | Disposition: A | Discharge status: Home / Self Care | Payer: Medicaid Other | Attending: Emergency Medicine | Admitting: Emergency Medicine

## 2013-09-20 ENCOUNTER — Encounter (HOSPITAL_BASED_OUTPATIENT_CLINIC_OR_DEPARTMENT_OTHER): Payer: Self-pay | Admitting: Emergency Medicine

## 2013-09-20 DIAGNOSIS — G43909 Migraine, unspecified, not intractable, without status migrainosus: Secondary | ICD-10-CM | POA: Insufficient documentation

## 2013-09-20 DIAGNOSIS — F3289 Other specified depressive episodes: Secondary | ICD-10-CM | POA: Insufficient documentation

## 2013-09-20 DIAGNOSIS — F411 Generalized anxiety disorder: Secondary | ICD-10-CM | POA: Insufficient documentation

## 2013-09-20 DIAGNOSIS — Z872 Personal history of diseases of the skin and subcutaneous tissue: Secondary | ICD-10-CM | POA: Insufficient documentation

## 2013-09-20 DIAGNOSIS — F329 Major depressive disorder, single episode, unspecified: Secondary | ICD-10-CM | POA: Insufficient documentation

## 2013-09-20 DIAGNOSIS — Z79899 Other long term (current) drug therapy: Secondary | ICD-10-CM | POA: Insufficient documentation

## 2013-09-20 DIAGNOSIS — Z8781 Personal history of (healed) traumatic fracture: Secondary | ICD-10-CM | POA: Insufficient documentation

## 2013-09-20 HISTORY — DX: Migraine, unspecified, not intractable, without status migrainosus: G43.909

## 2013-09-20 HISTORY — DX: Dermatitis, unspecified: L30.9

## 2013-09-20 LAB — CBC WITH DIFFERENTIAL/PLATELET
Eosinophils Absolute: 0.1 10*3/uL (ref 0.0–0.7)
Hemoglobin: 14 g/dL (ref 12.0–15.0)
Lymphocytes Relative: 24 % (ref 12–46)
Lymphs Abs: 2.6 10*3/uL (ref 0.7–4.0)
MCH: 29.1 pg (ref 26.0–34.0)
MCV: 87.5 fL (ref 78.0–100.0)
Monocytes Relative: 6 % (ref 3–12)
Neutrophils Relative %: 68 % (ref 43–77)
RBC: 4.81 MIL/uL (ref 3.87–5.11)
WBC: 10.9 10*3/uL — ABNORMAL HIGH (ref 4.0–10.5)

## 2013-09-20 LAB — URINALYSIS, ROUTINE W REFLEX MICROSCOPIC
Bilirubin Urine: NEGATIVE
Hgb urine dipstick: NEGATIVE
Nitrite: NEGATIVE
Specific Gravity, Urine: 1.021 (ref 1.005–1.030)
pH: 6 (ref 5.0–8.0)

## 2013-09-20 LAB — BASIC METABOLIC PANEL
BUN: 7 mg/dL (ref 6–23)
CO2: 25 mEq/L (ref 19–32)
GFR calc non Af Amer: >90 mL/min (ref >90)
Glucose, Bld: 102 mg/dL — ABNORMAL HIGH (ref 70–99)
Potassium: 3.8 mEq/L (ref 3.5–5.1)
Sodium: 140 mEq/L (ref 135–145)

## 2013-09-20 MED ORDER — KETOROLAC TROMETHAMINE 30 MG/ML IJ SOLN
30.0000 mg | Freq: Once | INTRAMUSCULAR | Status: AC
  Administered 2013-09-20: 30 mg via INTRAVENOUS
  Filled 2013-09-20: qty 1

## 2013-09-20 MED ORDER — LORAZEPAM 2 MG/ML IJ SOLN
1.0000 mg | Freq: Once | INTRAMUSCULAR | Status: AC
  Administered 2013-09-20: 1 mg via INTRAVENOUS
  Filled 2013-09-20: qty 1

## 2013-09-20 MED ORDER — METOCLOPRAMIDE HCL 10 MG PO TABS: 10.0000 mg | ORAL_TABLET | Freq: Four times a day (QID) | ORAL | Status: DC

## 2013-09-20 MED ORDER — SODIUM CHLORIDE 0.9 % IV SOLN
Freq: Once | INTRAVENOUS | Status: AC
  Administered 2013-09-20: 20:00:00 via INTRAVENOUS

## 2013-09-20 MED ORDER — IBUPROFEN 800 MG PO TABS: 800.0000 mg | ORAL_TABLET | Freq: Three times a day (TID) | ORAL | Status: DC

## 2013-09-20 MED ORDER — LORAZEPAM 1 MG PO TABS: 1.0000 mg | ORAL_TABLET | Freq: Two times a day (BID) | ORAL | Status: DC

## 2013-09-20 MED ORDER — METOCLOPRAMIDE HCL 5 MG/ML IJ SOLN
10.0000 mg | Freq: Once | INTRAMUSCULAR | Status: AC
  Administered 2013-09-20: 10 mg via INTRAVENOUS
  Filled 2013-09-20: qty 2

## 2013-09-20 NOTE — ED Provider Notes (Signed)
Medical screening examination/treatment/procedure(s) were performed by non-physician practitioner and as supervising physician I was immediately available for consultation/collaboration.  EKG Interpretation   None         Ezequiel Essex, MD 09/20/13 [phone]

## 2013-09-20 NOTE — ED Provider Notes (Signed)
CSN: [id number]     Arrival date & time 09/20/13  1748 History   First MD Initiated Contact with Patient 09/20/13 1936     Chief Complaint  Patient presents with  . Headache   (Consider location/radiation/quality/duration/timing/severity/associated sxs/prior Treatment) Patient is a 32 y.o. female presenting with headaches. The history is provided by the patient. No language interpreter was used.  Headache Pain location:  Generalized Quality:  Sharp Severity currently:  7/10 Severity at highest:  7/10 Onset quality:  Gradual Duration:  1 day Timing:  Constant Progression:  Worsening Chronicity:  New Similar to prior headaches: yes   Relieved by:  Nothing Worsened by:  Nothing tried Ineffective treatments:  Prescription medications, NSAIDs and cold packs Risk factors: no anger and does not have insomnia     Past Medical History  Diagnosis Date  . Ankle fracture   . Tumor containing calcium   . Depression   . Anxiety   . PTSD (post-traumatic stress disorder)   . Migraine   . Eczema    Past Surgical History  Procedure Laterality Date  . Cholecystectomy    . Hernia repair     No family history on file. History  Substance Use Topics  . Smoking status: Never Smoker   . Smokeless tobacco: Not on file  . Alcohol Use: Yes     Comment: occasional   OB History   Grav Para Term Preterm Abortions TAB SAB Ect Mult Living                 Review of Systems  Neurological: Positive for headaches.  All other systems reviewed and are negative.    Allergies  Azithromycin and Benzoyl peroxide  Home Medications   Current Outpatient Rx  Name  Route  Sig  Dispense  Refill  . OXcarbazepine (TRILEPTAL) 150 MG tablet   Oral   Take 150 mg by mouth 2 (two) times daily.         Marland Kitchen acetaminophen (TYLENOL) 500 MG tablet   Oral   Take 1,000 mg by mouth every 6 (six) hours as needed. For pain.         Marland Kitchen ALPRAZolam (XANAX) 0.25 MG tablet   Oral   Take 1 mg by mouth 3  (three) times daily as needed. For anxiety.         Marland Kitchen augmented betamethasone dipropionate (DIPROLENE AF) 0.05 % cream   Topical   Apply topically 2 (two) times daily.   50 g   5   . buPROPion (WELLBUTRIN XL) 150 MG 24 hr tablet   Oral   Take 150 mg by mouth daily.         . citalopram (CELEXA) 40 MG tablet   Oral   Take 40 mg by mouth daily.         . Escitalopram Oxalate (LEXAPRO PO)   Oral   Take by mouth.         . Gabapentin (NEURONTIN PO)   Oral   Take by mouth.         Marland Kitchen ibuprofen (ADVIL,MOTRIN) 200 MG tablet   Oral   Take 800 mg by mouth every 8 (eight) hours as needed. For pain.         Marland Kitchen loratadine (CLARITIN) 10 MG tablet   Oral   Take 10 mg by mouth at bedtime.         . predniSONE (DELTASONE) 20 MG tablet      3 daily for 4 days,  2 daily for 4 days, 1 daily for 4 days.   24 tablet   0   . UNABLE TO FIND      Propanolol          BP 140/96  Pulse 96  Temp(Src) 98.3 F (36.8 C) (Oral)  Resp 16  Ht 5' 3"  (1.6 m)  Wt 261 lb 1.6 oz (118.434 kg)  BMI 46.26 kg/m2  SpO2 97%  LMP 09/17/2013 Physical Exam  Nursing note and vitals reviewed. Constitutional: She is oriented to person, place, and time. She appears well-developed and well-nourished.  HENT:  Head: Normocephalic.  Right Ear: External ear normal.  Left Ear: External ear normal.  Nose: Nose normal.  Mouth/Throat: Oropharynx is clear and moist.  Eyes: Conjunctivae and EOM are normal. Pupils are equal, round, and reactive to light.  Neck: Normal range of motion. Neck supple.  Cardiovascular: Normal rate and regular rhythm.   Pulmonary/Chest: Effort normal and breath sounds normal.  Abdominal: Soft. She exhibits no distension.  Musculoskeletal: Normal range of motion.  Neurological: She is alert and oriented to person, place, and time.  Skin: Skin is warm.  Psychiatric: She has a normal mood and affect.    ED Course  Procedures (including critical care time) Labs  Review Labs Reviewed  CBC WITH DIFFERENTIAL - Abnormal; Notable for the following:    WBC 10.9 (*)    All other components within normal limits  BASIC METABOLIC PANEL - Abnormal; Notable for the following:    Glucose, Bld 102 (*)    All other components within normal limits  URINALYSIS, ROUTINE W REFLEX MICROSCOPIC   Imaging Review No results found.  EKG Interpretation   None       MDM   1. Headache    Pt reports headahe resolved with mediations,   Labs checked sodium normal.  Pt given rx for ibuprofen  reglan pills and ativan.  Pt advised to follow up with Dr. Lin Landsman next week for recheck    Fransico Meadow, PA-C 09/20/13 2211

## 2013-09-20 NOTE — ED Notes (Signed)
Headache that started today.  Hx of migraines.  Left flank pain x4 days.

## 2014-02-13 ENCOUNTER — Encounter (HOSPITAL_COMMUNITY): Payer: Self-pay | Admitting: Emergency Medicine

## 2014-02-13 ENCOUNTER — Emergency Department (INDEPENDENT_AMBULATORY_CARE_PROVIDER_SITE_OTHER): Payer: Medicaid Other

## 2014-02-13 ENCOUNTER — Emergency Department (INDEPENDENT_AMBULATORY_CARE_PROVIDER_SITE_OTHER)
Admission: EM | Admit: 2014-02-13 | Discharge: 2014-02-13 | Disposition: A | Discharge status: Home / Self Care | Payer: Medicaid Other | Source: Home / Self Care | Attending: Emergency Medicine | Admitting: Emergency Medicine

## 2014-02-13 DIAGNOSIS — R05 Cough: Secondary | ICD-10-CM

## 2014-02-13 DIAGNOSIS — R059 Cough, unspecified: Secondary | ICD-10-CM

## 2014-02-13 DIAGNOSIS — J189 Pneumonia, unspecified organism: Secondary | ICD-10-CM

## 2014-02-13 LAB — POCT RAPID STREP A: STREPTOCOCCUS, GROUP A SCREEN (DIRECT): NEGATIVE

## 2014-02-13 MED ORDER — IPRATROPIUM-ALBUTEROL 0.5-2.5 (3) MG/3ML IN SOLN
RESPIRATORY_TRACT | Status: AC
  Filled 2014-02-13: qty 3

## 2014-02-13 MED ORDER — DOXYCYCLINE HYCLATE 100 MG PO CAPS: 100.0000 mg | ORAL_CAPSULE | Freq: Two times a day (BID) | ORAL | Status: DC

## 2014-02-13 MED ORDER — PREDNISONE 20 MG PO TABS: 60.0000 mg | ORAL_TABLET | Freq: Every day | ORAL | Status: DC

## 2014-02-13 MED ORDER — HYDROCOD POLST-CHLORPHEN POLST 10-8 MG/5ML PO LQCR: 5.0000 mL | Freq: Two times a day (BID) | ORAL | Status: DC | PRN

## 2014-02-13 MED ORDER — IPRATROPIUM-ALBUTEROL 0.5-2.5 (3) MG/3ML IN SOLN
3.0000 mL | Freq: Once | RESPIRATORY_TRACT | Status: AC
  Administered 2014-02-13: 3 mL via RESPIRATORY_TRACT

## 2014-02-13 NOTE — Discharge Instructions (Signed)

## 2014-02-13 NOTE — ED Notes (Signed)
Patient states was seen at her doctor this past Monday Has been taking antibiotics and using an inhaler with no relief Complains of still coughing with a lot of chest congestion

## 2014-02-13 NOTE — ED Provider Notes (Signed)
Medical screening examination/treatment/procedure(s) were performed by non-physician practitioner and as supervising physician I was immediately available for consultation/collaboration.  Philipp Deputy, M.D.  Harden Mo, MD 02/13/14 2224

## 2014-02-13 NOTE — ED Provider Notes (Addendum)
CSN: [id number]     Arrival date & time 02/13/14  0909 History   First MD Initiated Contact with Patient 02/13/14 [phone]     Chief Complaint  Patient presents with  . Nasal Congestion  . Cough   (Consider location/radiation/quality/duration/timing/severity/associated sxs/prior Treatment)  Patient is a 33 y.o. female presenting with cough.  Cough Associated symptoms: chest pain, chills, fever and sore throat    The patient is a 33 year old female presenting today with complaints of wheezing and coughing for the past 8 days. Patient states she was seen last Monday in her primary care provider's office and given a steroid shot and prescribed antibiotics along with an albuterol inhaler. Patient states that she has been taking antibiotics along with using her inhaler twice daily without relief. In addition, the patient states she has been taking over-the-counter Tylenol and ibuprofen and she's used an entire bottle of Robitussin along with a box of Robitussin tablets.The patient states she's not sure she's been running a fever but reports chills and sweats intermittently this past week. Patient states she has a history of seasonal allergies for which she takes Claritin daily. In addition, she states she is having left "rib" pain which is significantly worse with coughing. Patient states she is concerned she "fractured a rib" either from horseplay or too much coughing.  Past Medical History  Diagnosis Date  . Ankle fracture   . Tumor containing calcium   . Depression   . Anxiety   . PTSD (post-traumatic stress disorder)   . Migraine   . Eczema    Past Surgical History  Procedure Laterality Date  . Cholecystectomy    . Hernia repair     No family history on file. History  Substance Use Topics  . Smoking status: Never Smoker   . Smokeless tobacco: Not on file  . Alcohol Use: Yes     Comment: occasional   OB History   Grav Para Term Preterm Abortions TAB SAB Ect Mult Living                  Review of Systems  Constitutional: Positive for fever, chills and fatigue.  HENT: Positive for congestion, sinus pressure and sore throat. Negative for trouble swallowing.   Eyes: Negative.   Respiratory: Positive for cough.   Cardiovascular: Positive for chest pain. Negative for palpitations and leg swelling.  Gastrointestinal: Positive for vomiting. Negative for nausea and diarrhea.  Endocrine: Negative.   Genitourinary: Negative.   Musculoskeletal: Negative.   Skin: Negative.   Allergic/Immunologic: Positive for environmental allergies.       Takes claritin daily.  Neurological: Negative.   Hematological: Negative.   Psychiatric/Behavioral: Negative.     Allergies  Azithromycin and Benzoyl peroxide  Home Medications   Prior to Admission medications   Medication Sig Start Date End Date Taking? Authorizing Provider  acetaminophen (TYLENOL) 500 MG tablet Take 1,000 mg by mouth every 6 (six) hours as needed. For pain.    Historical Provider, MD  ALPRAZolam Duanne Moron) 0.25 MG tablet Take 1 mg by mouth 3 (three) times daily as needed. For anxiety.    Historical Provider, MD  augmented betamethasone dipropionate (DIPROLENE AF) 0.05 % cream Apply topically 2 (two) times daily. 07/11/13   Harden Mo, MD  buPROPion (WELLBUTRIN XL) 150 MG 24 hr tablet Take 150 mg by mouth daily.    Historical Provider, MD  citalopram (CELEXA) 40 MG tablet Take 40 mg by mouth daily.    Historical Provider, MD  Escitalopram Oxalate (LEXAPRO PO) Take by mouth.    Historical Provider, MD  Gabapentin (NEURONTIN PO) Take by mouth.    Historical Provider, MD  ibuprofen (ADVIL,MOTRIN) 200 MG tablet Take 800 mg by mouth every 8 (eight) hours as needed. For pain.    Historical Provider, MD  ibuprofen (ADVIL,MOTRIN) 800 MG tablet Take 1 tablet (800 mg total) by mouth 3 (three) times daily. 09/20/13   Fransico Meadow, PA-C  loratadine (CLARITIN) 10 MG tablet Take 10 mg by mouth at bedtime.    Historical  Provider, MD  LORazepam (ATIVAN) 1 MG tablet Take 1 tablet (1 mg total) by mouth 2 (two) times daily. 09/20/13   Fransico Meadow, PA-C  metoCLOPramide (REGLAN) 10 MG tablet Take 1 tablet (10 mg total) by mouth 4 (four) times daily. 09/20/13   Fransico Meadow, PA-C  OXcarbazepine (TRILEPTAL) 150 MG tablet Take 150 mg by mouth 2 (two) times daily.    Historical Provider, MD  predniSONE (DELTASONE) 20 MG tablet 3 daily for 4 days, 2 daily for 4 days, 1 daily for 4 days. 07/11/13   Harden Mo, MD  UNABLE TO FIND Propanolol    Historical Provider, MD   BP 124/80  Pulse 96  Temp(Src) 98.5 F (36.9 C) (Oral)  Resp 20  SpO2 96%  Physical Exam  Nursing note and vitals reviewed. Constitutional: She is oriented to person, place, and time. She appears well-developed and well-nourished. No distress.  HENT:  Head: Normocephalic and atraumatic.  Right Ear: External ear normal.  Left Ear: External ear normal.  Mouth/Throat: Oropharynx is clear and moist. No oropharyngeal exudate.  Mild erythema in posterior oropharynx with no evidence of patches or exudate.  Bilateral tympanic membranes pearly gray in appearance, with bony prominences visualized.  Light reflexes present bilaterally. Lateral nares patent, mild swelling noted in nasal turbinates.  Eyes: Right eye exhibits no discharge. Left eye exhibits no discharge. No scleral icterus.  Neck: Normal range of motion. Neck supple. No tracheal deviation present. No thyromegaly present.  Mild anterior cervical lymphadenopathy.  Cardiovascular: Normal rate, regular rhythm, normal heart sounds and intact distal pulses.  Exam reveals no gallop and no friction rub.   No murmur heard. Pulmonary/Chest: Effort normal and breath sounds normal. No respiratory distress. She has no wheezes.  Fine crackles noted in R base.  Abdominal: Soft. Bowel sounds are normal. She exhibits no distension and no mass. There is no tenderness. There is no rebound and no guarding.   Examination limited by body habitus.  Lymphadenopathy:    She has cervical adenopathy.  Neurological: She is alert and oriented to person, place, and time.  Skin: Skin is warm and dry. No rash noted. She is not diaphoretic. No erythema. No pallor.  Psychiatric:  Patient has a significant psychiatric history for which she is currently still taking her medications. Patient does have a flat affect upon interview.    ED Course  Procedures (including critical care time) Labs Review Labs Reviewed  CULTURE, GROUP A STREP  POCT RAPID STREP A ([place])    Imaging Review Dg Chest 2 View  02/13/2014   CLINICAL DATA:  Eighty history of cough, fatigue and shortness of breath  EXAM: CHEST  2 VIEW  COMPARISON:  Prior chest x-ray 03/04/2011  FINDINGS: Patchy airspace opacity in the right lower lobe concerning for bronchopneumonia. Cardiac and mediastinal contours are within normal limits. The left lung is clear. No pneumothorax or pleural effusion. No acute osseous  abnormality. Surgical clips in the right upper quadrant suggest prior cholecystectomy.  IMPRESSION: Right lower lobe pneumonia.   Electronically Signed   By: Jacqulynn Cadet M.D.   On: 02/13/2014 11:20   Called and left message with patient that she needed to start a different antibiotic and follow up with her PCP for a repeat CXR in 6 weeks.   Doxycycline called in to patient's pharmacy.  MDM   1. Cough   2. Community acquired pneumonia    Meds ordered this encounter  Medications  . ipratropium-albuterol (DUONEB) 0.5-2.5 (3) MG/3ML nebulizer solution 3 mL    Sig:   . predniSONE (DELTASONE) 20 MG tablet    Sig: Take 3 tablets (60 mg total) by mouth daily.    Dispense:  15 tablet    Refill:  0  . chlorpheniramine-HYDROcodone (TUSSIONEX PENNKINETIC ER) 10-8 MG/5ML LQCR    Sig: Take 5 mLs by mouth every 12 (twelve) hours as needed for cough.    Dispense:  115 mL    Refill:  0  . doxycycline (VIBRAMYCIN) 100 MG capsule     Sig: Take 1 capsule (100 mg total) by mouth 2 (two) times daily.    Dispense:  20 capsule    Refill:  0   The patient verbalizes understanding and agrees to plan of care.       Jacqualyn Posey, NP 02/13/14 1210  Called patient to check to see if she was doing better and had started the different antibiotic as she never called back.  She had not started the doxy.  Advised to start today and discussed importance of follow up CXR in 6 weeks.    Jacqualyn Posey, NP 02/15/14 1208

## 2014-02-15 LAB — CULTURE, GROUP A STREP

## 2014-02-17 NOTE — ED Provider Notes (Signed)
Medical screening examination/treatment/procedure(s) were performed by non-physician practitioner and as supervising physician I was immediately available for consultation/collaboration.  Philipp Deputy, M.D.   Harden Mo, MD 02/17/14 [phone]

## 2014-12-17 ENCOUNTER — Emergency Department (HOSPITAL_BASED_OUTPATIENT_CLINIC_OR_DEPARTMENT_OTHER): Payer: Medicaid Other

## 2014-12-17 ENCOUNTER — Emergency Department (HOSPITAL_BASED_OUTPATIENT_CLINIC_OR_DEPARTMENT_OTHER)
Admission: EM | Admit: 2014-12-17 | Discharge: 2014-12-18 | Disposition: A | Discharge status: Home / Self Care | Payer: Medicaid Other | Attending: Emergency Medicine | Admitting: Emergency Medicine

## 2014-12-17 ENCOUNTER — Encounter (HOSPITAL_BASED_OUTPATIENT_CLINIC_OR_DEPARTMENT_OTHER): Payer: Self-pay

## 2014-12-17 DIAGNOSIS — T148XXA Other injury of unspecified body region, initial encounter: Secondary | ICD-10-CM

## 2014-12-17 DIAGNOSIS — Z792 Long term (current) use of antibiotics: Secondary | ICD-10-CM | POA: Diagnosis not present

## 2014-12-17 DIAGNOSIS — Y998 Other external cause status: Secondary | ICD-10-CM | POA: Insufficient documentation

## 2014-12-17 DIAGNOSIS — W108XXA Fall (on) (from) other stairs and steps, initial encounter: Secondary | ICD-10-CM | POA: Insufficient documentation

## 2014-12-17 DIAGNOSIS — Z872 Personal history of diseases of the skin and subcutaneous tissue: Secondary | ICD-10-CM | POA: Diagnosis not present

## 2014-12-17 DIAGNOSIS — Z8781 Personal history of (healed) traumatic fracture: Secondary | ICD-10-CM | POA: Insufficient documentation

## 2014-12-17 DIAGNOSIS — Z7952 Long term (current) use of systemic steroids: Secondary | ICD-10-CM | POA: Diagnosis not present

## 2014-12-17 DIAGNOSIS — F329 Major depressive disorder, single episode, unspecified: Secondary | ICD-10-CM | POA: Diagnosis not present

## 2014-12-17 DIAGNOSIS — S86912A Strain of unspecified muscle(s) and tendon(s) at lower leg level, left leg, initial encounter: Secondary | ICD-10-CM | POA: Diagnosis not present

## 2014-12-17 DIAGNOSIS — Y9301 Activity, walking, marching and hiking: Secondary | ICD-10-CM | POA: Diagnosis not present

## 2014-12-17 DIAGNOSIS — Z8639 Personal history of other endocrine, nutritional and metabolic disease: Secondary | ICD-10-CM | POA: Insufficient documentation

## 2014-12-17 DIAGNOSIS — F419 Anxiety disorder, unspecified: Secondary | ICD-10-CM | POA: Insufficient documentation

## 2014-12-17 DIAGNOSIS — F4310 Post-traumatic stress disorder, unspecified: Secondary | ICD-10-CM | POA: Insufficient documentation

## 2014-12-17 DIAGNOSIS — G43909 Migraine, unspecified, not intractable, without status migrainosus: Secondary | ICD-10-CM | POA: Diagnosis not present

## 2014-12-17 DIAGNOSIS — Y9289 Other specified places as the place of occurrence of the external cause: Secondary | ICD-10-CM | POA: Diagnosis not present

## 2014-12-17 DIAGNOSIS — S8992XA Unspecified injury of left lower leg, initial encounter: Secondary | ICD-10-CM | POA: Diagnosis present

## 2014-12-17 MED ORDER — IBUPROFEN 600 MG PO TABS: 600.0000 mg | ORAL_TABLET | Freq: Three times a day (TID) | ORAL | Status: DC | PRN

## 2014-12-17 MED ORDER — IBUPROFEN 400 MG PO TABS
600.0000 mg | ORAL_TABLET | Freq: Once | ORAL | Status: DC
  Filled 2014-12-17 (×2): qty 1

## 2014-12-17 NOTE — ED Notes (Addendum)
Alert, NAD, calm, interactive, resps e/u, no meds PTA. Here s/p 1900 fall, c/o L knee/tibial pain. Skin intact.

## 2014-12-17 NOTE — ED Provider Notes (Signed)
CSN: [id number]     Arrival date & time 12/17/14  2243 History  This chart was scribed for Kellie Rice, MD by [place]ique Peace, ED Scribe. The patient was seen in [place]. The patient's care was started at 11:23 PM.    Chief Complaint  Patient presents with  . Fall      Patient is a 34 y.o. female presenting with fall. The history is provided by the patient. No language interpreter was used.  Fall   HPI Comments: JOESPHINE Fisher is a 34 y.o. female who presents to the Emergency Department complaining of fall that occurred earlier today at 7 pm, where pt fell down several stairs. She explains that she kind of slid down the stairs when she fell with her left leg bent up under her and her right leg stretched out in front. Pt now complains of pain specifically inferior her left knee to the lateral aspect of her leg. No complaints of left knee pain. Pt is non-smoker. Ambulatory. No swelling or deformity.   Past Medical History  Diagnosis Date  . Ankle fracture   . Tumor containing calcium   . Depression   . Anxiety   . PTSD (post-traumatic stress disorder)   . Migraine   . Eczema    Past Surgical History  Procedure Laterality Date  . Cholecystectomy    . Hernia repair     No family history on file. History  Substance Use Topics  . Smoking status: Never Smoker   . Smokeless tobacco: Not on file  . Alcohol Use: No     Comment: occasional   OB History    No data available     Review of Systems  Musculoskeletal: Negative for back pain and arthralgias.  Skin: Negative for wound.  Neurological: Negative for weakness and numbness.  All other systems reviewed and are negative.     Allergies  Azithromycin and Benzoyl peroxide  Home Medications   Prior to Admission medications   Medication Sig Start Date End Date Taking? Authorizing Provider  etonogestrel-ethinyl estradiol (NUVARING) 0.12-0.015 MG/24HR vaginal ring Place 1 each vaginally every 28 (twenty-eight) days.  Insert vaginally and leave in place for 3 consecutive weeks, then remove for 1 week.   Yes Historical Provider, MD  hydrOXYzine (ATARAX/VISTARIL) 10 MG tablet Take 10 mg by mouth 3 (three) times daily as needed.   Yes Historical Provider, MD  nebivolol (BYSTOLIC) 10 MG tablet Take 10 mg by mouth daily.   Yes Historical Provider, MD  acetaminophen (TYLENOL) 500 MG tablet Take 1,000 mg by mouth every 6 (six) hours as needed. For pain.    Historical Provider, MD  ALPRAZolam Duanne Moron) 0.25 MG tablet Take 1 mg by mouth 3 (three) times daily as needed. For anxiety.    Historical Provider, MD  augmented betamethasone dipropionate (DIPROLENE AF) 0.05 % cream Apply topically 2 (two) times daily. 07/11/13   Harden Mo, MD  buPROPion (WELLBUTRIN XL) 150 MG 24 hr tablet Take 150 mg by mouth daily.    Historical Provider, MD  chlorpheniramine-HYDROcodone (TUSSIONEX PENNKINETIC ER) 10-8 MG/5ML LQCR Take 5 mLs by mouth every 12 (twelve) hours as needed for cough. 02/13/14   Nehemiah Settle, NP  citalopram (CELEXA) 40 MG tablet Take 40 mg by mouth daily.    Historical Provider, MD  doxycycline (VIBRAMYCIN) 100 MG capsule Take 1 capsule (100 mg total) by mouth 2 (two) times daily. 02/13/14   Nehemiah Settle, NP  Escitalopram Oxalate (LEXAPRO PO) Take by  mouth.    Historical Provider, MD  Gabapentin (NEURONTIN PO) Take by mouth.    Historical Provider, MD  ibuprofen (ADVIL,MOTRIN) 600 MG tablet Take 1 tablet (600 mg total) by mouth every 8 (eight) hours as needed for moderate pain. 12/17/14   Kellie Rice, MD  loratadine (CLARITIN) 10 MG tablet Take 10 mg by mouth at bedtime.    Historical Provider, MD  LORazepam (ATIVAN) 1 MG tablet Take 1 tablet (1 mg total) by mouth 2 (two) times daily. 09/20/13   Fransico Meadow, PA-C  metoCLOPramide (REGLAN) 10 MG tablet Take 1 tablet (10 mg total) by mouth 4 (four) times daily. 09/20/13   Fransico Meadow, PA-C  OXcarbazepine (TRILEPTAL) 150 MG tablet Take 150 mg by mouth 2  (two) times daily.    Historical Provider, MD  predniSONE (DELTASONE) 20 MG tablet 3 daily for 4 days, 2 daily for 4 days, 1 daily for 4 days. 07/11/13   Harden Mo, MD  predniSONE (DELTASONE) 20 MG tablet Take 3 tablets (60 mg total) by mouth daily. 02/13/14   Nehemiah Settle, NP  UNABLE TO FIND Propanolol    Historical Provider, MD   BP 139/74 mmHg  Pulse 86  Temp(Src) 98.8 F (37.1 C) (Oral)  Resp 18  Ht 5' 3"  (1.6 m)  Wt 260 lb (117.935 kg)  BMI 46.07 kg/m2  SpO2 98%  LMP  Physical Exam  Constitutional: She is oriented to person, place, and time. She appears well-developed and well-nourished. No distress.  HENT:  Head: Normocephalic and atraumatic.  Eyes: EOM are normal. Pupils are equal, round, and reactive to light.  Neck: Normal range of motion. Neck supple.  Cardiovascular: Normal rate and regular rhythm.   Pulmonary/Chest: Effort normal and breath sounds normal.  Abdominal: Soft. Bowel sounds are normal.  Musculoskeletal: Normal range of motion. She exhibits tenderness. She exhibits no edema.  Full range of motion of the left knee. There is no ligamentous instability. No joint effusion. No deformity. Patient has very mild tenderness to palpation over the proximal anterior tibialis muscle. Distal pulses intact.   Neurological: She is alert and oriented to person, place, and time.  5/5 motor in all extremities. Sensation is intact. Patient is ambulatory.  Skin: Skin is warm and dry. No rash noted. No erythema.  Psychiatric: She has a normal mood and affect. Her behavior is normal.  Nursing note and vitals reviewed.   ED Course  Procedures (including critical care time) Labs Review Labs Reviewed - No data to display  Imaging Review Dg Tibia/fibula Left  12/17/2014   CLINICAL DATA:  Left knee pain after trip down stairs. Unable to bear weight.  EXAM: LEFT TIBIA AND FIBULA - 2 VIEW  COMPARISON:  None.  FINDINGS: The cortical margins of the tibia and fibula are intact.  There is no fracture. Ankle alignment is maintained. Mild soft tissue edema about the calf.  IMPRESSION: No fracture of the left tibia/ fibula.  Mild soft tissue edema.   Electronically Signed   By: Jeb Levering M.D.   On: 12/17/2014 23:40   Dg Knee Complete 4 Views Left  12/17/2014   CLINICAL DATA:  Left knee pain after trip down stairs. Unable to bear weight.  EXAM: LEFT KNEE - COMPLETE 4+ VIEW  COMPARISON:  None.  FINDINGS: No fracture or dislocation. The alignment and joint spaces are maintained. There is no joint effusion. No focal soft tissue abnormality.  IMPRESSION: No fracture or dislocation.  No joint effusion.  Electronically Signed   By: Jeb Levering M.D.   On: 12/17/2014 23:39     EKG Interpretation None     Medications  ibuprofen (ADVIL,MOTRIN) tablet 600 mg (not administered)    11:26 PM- Treatment plan was discussed with patient who verbalizes understanding and agrees.   MDM   Final diagnoses:  Muscle strain    I personally performed the services described in this documentation, which was scribed in my presence. The recorded information has been reviewed and is accurate.  Suspect muscle strain. No obvious injury on x-ray. Recommended ice and elevation. We'll also treat with NSAIDs.   Kellie Rice, MD 12/17/14 [phone]

## 2014-12-17 NOTE — Discharge Instructions (Signed)

## 2014-12-17 NOTE — ED Notes (Signed)
Pt reports was walking down stairs when tripped, L leg went behind her, states pain in left knee and upper lateral calf area.  No deformity noted.

## 2014-12-17 NOTE — ED Notes (Signed)
Pt in xray, not in room, family remains in room.

## 2014-12-18 NOTE — ED Notes (Signed)
Pt did not want to take ibuprofen as ordered in the ED.

## 2015-05-07 ENCOUNTER — Encounter (HOSPITAL_COMMUNITY): Payer: Self-pay | Admitting: Emergency Medicine

## 2015-05-07 ENCOUNTER — Emergency Department (INDEPENDENT_AMBULATORY_CARE_PROVIDER_SITE_OTHER)
Admission: EM | Admit: 2015-05-07 | Discharge: 2015-05-07 | Disposition: A | Discharge status: Home / Self Care | Payer: Medicaid Other | Source: Home / Self Care | Attending: Family Medicine | Admitting: Family Medicine

## 2015-05-07 DIAGNOSIS — L301 Dyshidrosis [pompholyx]: Secondary | ICD-10-CM

## 2015-05-07 MED ORDER — PREDNISONE 10 MG (48) PO TBPK: ORAL_TABLET | Freq: Every day | ORAL | Status: DC

## 2015-05-07 MED ORDER — CLOBETASOL PROPIONATE 0.05 % EX CREA: 1.0000 "application " | TOPICAL_CREAM | Freq: Two times a day (BID) | CUTANEOUS | Status: DC

## 2015-05-07 NOTE — ED Notes (Signed)
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

## 2015-05-07 NOTE — ED Provider Notes (Signed)
CSN: [id number]     Arrival date & time 05/07/15  1332 History   First MD Initiated Contact with Patient 05/07/15 1403     Chief Complaint  Patient presents with  . Rash   (Consider location/radiation/quality/duration/timing/severity/associated sxs/prior Treatment) HPI Comments: Patient presents with an "eczema" outbreak. She was treated for this in the past. Dr. Fernanda Drum diagnosed her with eczema in 2014 and she thinks this is the same. Her rash is very pruritic. No other exposures are noted at this time. No pain associated with.   Patient is a 34 y.o. female presenting with rash. The history is provided by the patient.  Rash   Past Medical History  Diagnosis Date  . Ankle fracture   . Tumor containing calcium   . Depression   . Anxiety   . PTSD (post-traumatic stress disorder)   . Migraine   . Eczema    Past Surgical History  Procedure Laterality Date  . Cholecystectomy    . Hernia repair     No family history on file. Social History  Substance Use Topics  . Smoking status: Never Smoker   . Smokeless tobacco: None  . Alcohol Use: No     Comment: occasional   OB History    No data available     Review of Systems  Skin: Positive for rash.  All other systems reviewed and are negative.   Allergies  Azithromycin and Benzoyl peroxide  Home Medications   Prior to Admission medications   Medication Sig Start Date End Date Taking? Authorizing Provider  acetaminophen (TYLENOL) 500 MG tablet Take 1,000 mg by mouth every 6 (six) hours as needed. For pain.    Historical Provider, MD  ALPRAZolam Duanne Moron) 0.25 MG tablet Take 1 mg by mouth 3 (three) times daily as needed. For anxiety.    Historical Provider, MD  augmented betamethasone dipropionate (DIPROLENE AF) 0.05 % cream Apply topically 2 (two) times daily. 07/11/13   Harden Mo, MD  buPROPion (WELLBUTRIN XL) 150 MG 24 hr tablet Take 150 mg by mouth daily.    Historical Provider, MD  chlorpheniramine-HYDROcodone  (TUSSIONEX PENNKINETIC ER) 10-8 MG/5ML LQCR Take 5 mLs by mouth every 12 (twelve) hours as needed for cough. 02/13/14   Nehemiah Settle, NP  citalopram (CELEXA) 40 MG tablet Take 40 mg by mouth daily.    Historical Provider, MD  clobetasol cream (TEMOVATE) 6.78 % Apply 1 application topically 2 (two) times daily. 05/07/15   Bjorn Pippin, PA-C  doxycycline (VIBRAMYCIN) 100 MG capsule Take 1 capsule (100 mg total) by mouth 2 (two) times daily. 02/13/14   Nehemiah Settle, NP  Escitalopram Oxalate (LEXAPRO PO) Take by mouth.    Historical Provider, MD  etonogestrel-ethinyl estradiol (NUVARING) 0.12-0.015 MG/24HR vaginal ring Place 1 each vaginally every 28 (twenty-eight) days. Insert vaginally and leave in place for 3 consecutive weeks, then remove for 1 week.    Historical Provider, MD  Gabapentin (NEURONTIN PO) Take by mouth.    Historical Provider, MD  hydrOXYzine (ATARAX/VISTARIL) 10 MG tablet Take 10 mg by mouth 3 (three) times daily as needed.    Historical Provider, MD  ibuprofen (ADVIL,MOTRIN) 600 MG tablet Take 1 tablet (600 mg total) by mouth every 8 (eight) hours as needed for moderate pain. 12/17/14   Julianne Rice, MD  loratadine (CLARITIN) 10 MG tablet Take 10 mg by mouth at bedtime.    Historical Provider, MD  LORazepam (ATIVAN) 1 MG tablet Take 1 tablet (1 mg total)  by mouth 2 (two) times daily. 09/20/13   Fransico Meadow, PA-C  metoCLOPramide (REGLAN) 10 MG tablet Take 1 tablet (10 mg total) by mouth 4 (four) times daily. 09/20/13   Fransico Meadow, PA-C  nebivolol (BYSTOLIC) 10 MG tablet Take 10 mg by mouth daily.    Historical Provider, MD  OXcarbazepine (TRILEPTAL) 150 MG tablet Take 150 mg by mouth 2 (two) times daily.    Historical Provider, MD  predniSONE (STERAPRED UNI-PAK 48 TAB) 10 MG (48) TBPK tablet Take by mouth daily. Take daily as directed 05/07/15   Bjorn Pippin, PA-C  UNABLE TO FIND Propanolol    Historical Provider, MD   BP 98/61 mmHg  Pulse 77  Temp(Src) 99 F  (37.2 C) (Oral)  Resp 18  SpO2 99% Physical Exam  Constitutional: She is oriented to person, place, and time. She appears well-developed and well-nourished. No distress.  HENT:  Head: Normocephalic and atraumatic.  Pulmonary/Chest: Effort normal.  Neurological: She is alert and oriented to person, place, and time.  Skin: Skin is warm and dry. Rash noted. She is not diaphoretic.  Macular papular rash to extremities and trunk, few patches. No web space findings, no burrows  Psychiatric: Her behavior is normal.  Nursing note and vitals reviewed.   ED Course  Procedures (including critical care time) Labs Review Labs Reviewed - No data to display  Imaging Review No results found.   MDM   1. Dyshidrotic eczema    Past history of eczema. Exam correlates. Treat with Pred Pack and Steroid cream. F/U with Dr. Myles Gip if worsens    Bjorn Pippin, PA-C 05/07/15 1421

## 2015-05-07 NOTE — Discharge Instructions (Signed)
Eczema Eczema, also called atopic dermatitis, is a skin disorder that causes inflammation of the skin. It causes a red rash and dry, scaly skin. The skin becomes very itchy. Eczema is generally worse during the cooler winter months and often improves with the warmth of summer. Eczema usually starts showing signs in infancy. Some children outgrow eczema, but it may last through adulthood.  CAUSES  The exact cause of eczema is not known, but it appears to run in families. People with eczema often have a family history of eczema, allergies, asthma, or hay fever. Eczema is not contagious. Flare-ups of the condition may be caused by:   Contact with something you are sensitive or allergic to.   Stress. SIGNS AND SYMPTOMS  Dry, scaly skin.   Red, itchy rash.   Itchiness. This may occur before the skin rash and may be very intense.  DIAGNOSIS  The diagnosis of eczema is usually made based on symptoms and medical history. TREATMENT  Eczema cannot be cured, but symptoms usually can be controlled with treatment and other strategies. A treatment plan might include:  Controlling the itching and scratching.   Use over-the-counter antihistamines as directed for itching. This is especially useful at night when the itching tends to be worse.   Use over-the-counter steroid creams as directed for itching.   Avoid scratching. Scratching makes the rash and itching worse. It may also result in a skin infection (impetigo) due to a break in the skin caused by scratching.   Keeping the skin well moisturized with creams every day. This will seal in moisture and help prevent dryness. Lotions that contain alcohol and water should be avoided because they can dry the skin.   Limiting exposure to things that you are sensitive or allergic to (allergens).   Recognizing situations that cause stress.   Developing a plan to manage stress.  HOME CARE INSTRUCTIONS   Only take over-the-counter or  prescription medicines as directed by your health care provider.   Do not use anything on the skin without checking with your health care provider.   Keep baths or showers short (5 minutes) in warm (not hot) water. Use mild cleansers for bathing. These should be unscented. You may add nonperfumed bath oil to the bath water. It is best to avoid soap and bubble bath.   Immediately after a bath or shower, when the skin is still damp, apply a moisturizing ointment to the entire body. This ointment should be a petroleum ointment. This will seal in moisture and help prevent dryness. The thicker the ointment, the better. These should be unscented.   Keep fingernails cut short. Children with eczema may need to wear soft gloves or mittens at night after applying an ointment.   Dress in clothes made of cotton or cotton blends. Dress lightly, because heat increases itching.   A child with eczema should stay away from anyone with fever blisters or cold sores. The virus that causes fever blisters (herpes simplex) can cause a serious skin infection in children with eczema. SEEK MEDICAL CARE IF:   Your itching interferes with sleep.   Your rash gets worse or is not better within 1 week after starting treatment.   You see pus or soft yellow scabs in the rash area.   You have a fever.   You have a rash flare-up after contact with someone who has fever blisters.  Document Released: 09/06/2000 Document Revised: 06/30/2013 Document Reviewed: 04/12/2013 [facility] Patient Information 2015 [place], [place]. This information  is not intended to replace advice given to you by your health care provider. Make sure you discuss any questions you have with your health care provider.   If continued occurrence f/u with Dr. Allyson Sabal. Feel better!

## 2016-02-28 ENCOUNTER — Other Ambulatory Visit (HOSPITAL_COMMUNITY): Payer: Self-pay | Admitting: Sports Medicine

## 2016-02-28 ENCOUNTER — Inpatient Hospital Stay (HOSPITAL_COMMUNITY): Admission: RE | Admit: 2016-02-28 | Payer: Medicaid Other | Source: Ambulatory Visit

## 2016-02-28 DIAGNOSIS — M79661 Pain in right lower leg: Secondary | ICD-10-CM

## 2016-02-28 DIAGNOSIS — M7989 Other specified soft tissue disorders: Principal | ICD-10-CM

## 2016-02-29 ENCOUNTER — Ambulatory Visit (HOSPITAL_COMMUNITY)
Admission: RE | Admit: 2016-02-29 | Discharge: 2016-02-29 | Disposition: A | Discharge status: Home / Self Care | Payer: Medicaid Other | Source: Ambulatory Visit | Attending: Internal Medicine | Admitting: Internal Medicine

## 2016-02-29 DIAGNOSIS — M7989 Other specified soft tissue disorders: Secondary | ICD-10-CM | POA: Diagnosis not present

## 2016-02-29 DIAGNOSIS — M79604 Pain in right leg: Secondary | ICD-10-CM | POA: Diagnosis not present

## 2016-02-29 DIAGNOSIS — M25561 Pain in right knee: Secondary | ICD-10-CM | POA: Insufficient documentation

## 2016-02-29 DIAGNOSIS — M79661 Pain in right lower leg: Secondary | ICD-10-CM

## 2016-02-29 NOTE — Progress Notes (Signed)
VASCULAR LAB PRELIMINARY  PRELIMINARY  PRELIMINARY  PRELIMINARY  Right lower extremity venous duplex completed.    Preliminary report:  Right:  No evidence of DVT, superficial thrombosis, or Baker's cyst.  Anarely Nicholls, RVS 02/29/2016, 9:17 AM

## 2016-07-22 ENCOUNTER — Ambulatory Visit (HOSPITAL_COMMUNITY)
Admission: EM | Admit: 2016-07-22 | Discharge: 2016-07-22 | Disposition: A | Discharge status: Home / Self Care | Payer: Medicaid Other | Attending: Emergency Medicine | Admitting: Emergency Medicine

## 2016-07-22 ENCOUNTER — Encounter (HOSPITAL_COMMUNITY): Payer: Self-pay | Admitting: Emergency Medicine

## 2016-07-22 DIAGNOSIS — J40 Bronchitis, not specified as acute or chronic: Secondary | ICD-10-CM | POA: Diagnosis not present

## 2016-07-22 MED ORDER — IPRATROPIUM BROMIDE 0.06 % NA SOLN: 2.0000 | Freq: Four times a day (QID) | NASAL | 12 refills | Status: DC

## 2016-07-22 MED ORDER — BENZONATATE 100 MG PO CAPS: 200.0000 mg | ORAL_CAPSULE | Freq: Three times a day (TID) | ORAL | 0 refills | Status: DC | PRN

## 2016-07-22 NOTE — ED Provider Notes (Signed)
CSN: [id number]     Arrival date & time 07/22/16  1402 History   None    Chief Complaint  Patient presents with  . Cough   (Consider location/radiation/quality/duration/timing/severity/associated sxs/prior Treatment) Patient presents with c/o cough and uri sx's ongoing for 2 months.  She has been on 2 rounds of medrol dose packs and antibiotics and is still having congestion, cough, and feels lousy.   The history is provided by the patient.  Cough  Cough characteristics:  Barking and non-productive Sputum characteristics:  Nondescript Severity:  Moderate Onset quality:  Gradual Duration:  8 weeks Timing:  Constant Progression:  Waxing and waning Chronicity:  Chronic Relieved by:  Nothing Worsened by:  Nothing Ineffective treatments:  None tried   Past Medical History:  Diagnosis Date  . Ankle fracture   . Anxiety   . Depression   . Eczema   . Migraine   . PTSD (post-traumatic stress disorder)   . Tumor containing calcium    Past Surgical History:  Procedure Laterality Date  . CHOLECYSTECTOMY    . HERNIA REPAIR     History reviewed. No pertinent family history. Social History  Substance Use Topics  . Smoking status: Never Smoker  . Smokeless tobacco: Never Used  . Alcohol use No     Comment: occasional   OB History    No data available     Review of Systems  Constitutional: Positive for fatigue.  HENT: Negative.   Eyes: Negative.   Respiratory: Positive for cough.   Cardiovascular: Negative.   Gastrointestinal: Negative.   Endocrine: Negative.   Genitourinary: Negative.   Musculoskeletal: Negative.   Skin: Negative.   Allergic/Immunologic: Negative.   Neurological: Negative.   Hematological: Negative.   Psychiatric/Behavioral: Negative.     Allergies  Azithromycin and Benzoyl peroxide  Home Medications   Prior to Admission medications   Medication Sig Start Date End Date Taking? Authorizing Provider  Escitalopram Oxalate (LEXAPRO PO) Take  by mouth.   Yes Historical Provider, MD  etonogestrel-ethinyl estradiol (NUVARING) 0.12-0.015 MG/24HR vaginal ring Place 1 each vaginally every 28 (twenty-eight) days. Insert vaginally and leave in place for 3 consecutive weeks, then remove for 1 week.   Yes Historical Provider, MD  Gabapentin (NEURONTIN PO) Take by mouth.   Yes Historical Provider, MD  hydrOXYzine (ATARAX/VISTARIL) 10 MG tablet Take 10 mg by mouth 3 (three) times daily as needed.   Yes Historical Provider, MD  ibuprofen (ADVIL,MOTRIN) 600 MG tablet Take 1 tablet (600 mg total) by mouth every 8 (eight) hours as needed for moderate pain. 12/17/14  Yes Julianne Rice, MD  predniSONE (STERAPRED UNI-PAK 48 TAB) 10 MG (48) TBPK tablet Take by mouth daily. Take daily as directed 05/07/15  Yes Bjorn Pippin, PA-C  acetaminophen (TYLENOL) 500 MG tablet Take 1,000 mg by mouth every 6 (six) hours as needed. For pain.    Historical Provider, MD  ALPRAZolam Duanne Moron) 0.25 MG tablet Take 1 mg by mouth 3 (three) times daily as needed. For anxiety.    Historical Provider, MD  augmented betamethasone dipropionate (DIPROLENE AF) 0.05 % cream Apply topically 2 (two) times daily. 07/11/13   Harden Mo, MD  benzonatate (TESSALON) 100 MG capsule Take 2 capsules (200 mg total) by mouth 3 (three) times daily as needed for cough. 07/22/16   Lysbeth Penner, FNP  buPROPion (WELLBUTRIN XL) 150 MG 24 hr tablet Take 150 mg by mouth daily.    Historical Provider, MD  chlorpheniramine-HYDROcodone Amanda Cockayne [facility])  10-8 MG/5ML LQCR Take 5 mLs by mouth every 12 (twelve) hours as needed for cough. 02/13/14   Nehemiah Settle, NP  citalopram (CELEXA) 40 MG tablet Take 40 mg by mouth daily.    Historical Provider, MD  clobetasol cream (TEMOVATE) 4.70 % Apply 1 application topically 2 (two) times daily. 05/07/15   Bjorn Pippin, PA-C  doxycycline (VIBRAMYCIN) 100 MG capsule Take 1 capsule (100 mg total) by mouth 2 (two) times daily. 02/13/14   Nehemiah Settle, NP  ipratropium (ATROVENT) 0.06 % nasal spray Place 2 sprays into both nostrils 4 (four) times daily. 07/22/16   Lysbeth Penner, FNP  loratadine (CLARITIN) 10 MG tablet Take 10 mg by mouth at bedtime.    Historical Provider, MD  LORazepam (ATIVAN) 1 MG tablet Take 1 tablet (1 mg total) by mouth 2 (two) times daily. 09/20/13   Fransico Meadow, PA-C  metoCLOPramide (REGLAN) 10 MG tablet Take 1 tablet (10 mg total) by mouth 4 (four) times daily. 09/20/13   Fransico Meadow, PA-C  nebivolol (BYSTOLIC) 10 MG tablet Take 10 mg by mouth daily.    Historical Provider, MD  OXcarbazepine (TRILEPTAL) 150 MG tablet Take 150 mg by mouth 2 (two) times daily.    Historical Provider, MD  UNABLE TO FIND Propanolol    Historical Provider, MD   Meds Ordered and Administered this Visit  Medications - No data to display  BP 148/93 (BP Location: Left Arm)   Pulse 109   Temp 98 F (36.7 C) (Oral)   Resp 20   SpO2 97%  No data found.   Physical Exam  Constitutional: She is oriented to person, place, and time. She appears well-developed and well-nourished.  HENT:  Head: Normocephalic and atraumatic.  Right Ear: External ear normal.  Left Ear: External ear normal.  Mouth/Throat: Oropharynx is clear and moist.  Eyes: Conjunctivae and EOM are normal. Pupils are equal, round, and reactive to light.  Neck: Normal range of motion. Neck supple.  Cardiovascular: Normal rate, regular rhythm and normal heart sounds.   Pulmonary/Chest: Effort normal and breath sounds normal.  Abdominal: Soft. Bowel sounds are normal.  Neurological: She is alert and oriented to person, place, and time.  Nursing note and vitals reviewed.   Urgent Care Course   Clinical Course    Procedures (including critical care time)  Labs Review Labs Reviewed - No data to display  Imaging Review No results found.   Visual Acuity Review  Right Eye Distance:   Left Eye Distance:   Bilateral Distance:    Right Eye Near:    Left Eye Near:    Bilateral Near:         MDM   Bronchitis - Atrovent nasal spray 0.06% 2 sprays per nostril qid prn #74m Tessalon Perles 2084mone po tid prn #30 Push po fluids, rest, tylenol and motrin otc prn as directed for fever, arthralgias, and myalgias.  Follow up prn if sx's continue or persist.    WiLysbeth PennerFNP 07/22/16 [phone]

## 2016-07-22 NOTE — ED Triage Notes (Signed)
The patient presented to the [facility] with a complaint of a cough and shortness of breath x 2 months. The patient stated that she has completed 2 rounds of oral steroids and 1 round of amoxiclave. She stated that the cough has dried up but she is still short of breath.

## 2016-08-03 ENCOUNTER — Ambulatory Visit (INDEPENDENT_AMBULATORY_CARE_PROVIDER_SITE_OTHER): Payer: Medicaid Other

## 2016-08-03 ENCOUNTER — Encounter (HOSPITAL_COMMUNITY): Payer: Self-pay | Admitting: Emergency Medicine

## 2016-08-03 ENCOUNTER — Ambulatory Visit (HOSPITAL_COMMUNITY)
Admission: EM | Admit: 2016-08-03 | Discharge: 2016-08-03 | Disposition: A | Discharge status: Home / Self Care | Payer: Medicaid Other | Attending: Emergency Medicine | Admitting: Emergency Medicine

## 2016-08-03 DIAGNOSIS — S93601A Unspecified sprain of right foot, initial encounter: Secondary | ICD-10-CM

## 2016-08-03 DIAGNOSIS — M25571 Pain in right ankle and joints of right foot: Secondary | ICD-10-CM | POA: Diagnosis not present

## 2016-08-03 MED ORDER — HYDROCODONE-ACETAMINOPHEN 5-325 MG PO TABS: 2.0000 | ORAL_TABLET | ORAL | 0 refills | Status: DC | PRN

## 2016-08-03 NOTE — ED Triage Notes (Addendum)
Turned around on uneven concrete and fell to the ground.  Fell on concrete.  Patient heard a crunch noise and has pain walking.  Pain in lateral foot.  Patient can feel toes "a little weird".  Patient can move toes and this causes pain in foot.  Patient reports flexing of foot causes pain.  Left pedal pulses 2 plus

## 2016-08-03 NOTE — ED Provider Notes (Signed)
CSN: [id number]     Arrival date & time 08/03/16  1200 History   First MD Initiated Contact with Patient 08/03/16 1215     Chief Complaint  Patient presents with  . Ankle Pain   (Consider location/radiation/quality/duration/timing/severity/associated sxs/prior Treatment)  HPI   Patient is a 35 year old female presenting smiling with complaints of left ankle pain. Patient states she was taking out the trash this morning when she took a misstep off of a concrete curb and inverted her left foot/ankle. Patient states she is unable to bear weight now due to discomfort. Pain noted over the posterior tibialis tendons and fifth metatarsal.  States took ibuprofen for discomfort at approximately 8:30 this morning.  Past Medical History:  Diagnosis Date  . Ankle fracture   . Anxiety   . Depression   . Eczema   . Migraine   . PTSD (post-traumatic stress disorder)   . Tumor containing calcium    Past Surgical History:  Procedure Laterality Date  . CHOLECYSTECTOMY    . HERNIA REPAIR     No family history on file. Social History  Substance Use Topics  . Smoking status: Never Smoker  . Smokeless tobacco: Never Used  . Alcohol use No     Comment: occasional   OB History    No data available     Review of Systems  Constitutional: Negative.  Negative for fatigue and fever.  HENT: Negative.   Eyes: Negative.  Negative for visual disturbance.  Respiratory: Negative.  Negative for cough and shortness of breath.   Cardiovascular: Negative.  Negative for chest pain and leg swelling.  Gastrointestinal: Negative.   Endocrine: Negative.   Genitourinary: Negative.   Musculoskeletal: Negative for gait problem and joint swelling.  Skin: Positive for wound. Negative for color change.       Abrasion to R knee secondary to fall, states through her pants.  Allergic/Immunologic: Negative.   Neurological: Negative for dizziness and weakness.  Hematological: Negative.   Psychiatric/Behavioral:  Negative.     Allergies  Azithromycin and Benzoyl peroxide  Home Medications   Prior to Admission medications   Medication Sig Start Date End Date Taking? Authorizing Provider  acetaminophen (TYLENOL) 500 MG tablet Take 1,000 mg by mouth every 6 (six) hours as needed. For pain.    Historical Provider, MD  ALPRAZolam Duanne Moron) 0.25 MG tablet Take 1 mg by mouth 3 (three) times daily as needed. For anxiety.    Historical Provider, MD  augmented betamethasone dipropionate (DIPROLENE AF) 0.05 % cream Apply topically 2 (two) times daily. 07/11/13   Harden Mo, MD  benzonatate (TESSALON) 100 MG capsule Take 2 capsules (200 mg total) by mouth 3 (three) times daily as needed for cough. 07/22/16   Lysbeth Penner, FNP  buPROPion (WELLBUTRIN XL) 150 MG 24 hr tablet Take 150 mg by mouth daily.    Historical Provider, MD  chlorpheniramine-HYDROcodone (TUSSIONEX PENNKINETIC ER) 10-8 MG/5ML LQCR Take 5 mLs by mouth every 12 (twelve) hours as needed for cough. 02/13/14   Nehemiah Settle, NP  citalopram (CELEXA) 40 MG tablet Take 40 mg by mouth daily.    Historical Provider, MD  clobetasol cream (TEMOVATE) 1.30 % Apply 1 application topically 2 (two) times daily. 05/07/15   Bjorn Pippin, PA-C  doxycycline (VIBRAMYCIN) 100 MG capsule Take 1 capsule (100 mg total) by mouth 2 (two) times daily. Patient not taking: Reported on 08/03/2016 02/13/14   Nehemiah Settle, NP  Escitalopram Oxalate (LEXAPRO PO) Take by  mouth.    Historical Provider, MD  etonogestrel-ethinyl estradiol (NUVARING) 0.12-0.015 MG/24HR vaginal ring Place 1 each vaginally every 28 (twenty-eight) days. Insert vaginally and leave in place for 3 consecutive weeks, then remove for 1 week.    Historical Provider, MD  Gabapentin (NEURONTIN PO) Take by mouth.    Historical Provider, MD  HYDROcodone-acetaminophen (NORCO/VICODIN) 5-325 MG tablet Take 2 tablets by mouth every 4 (four) hours as needed. 08/03/16   Nehemiah Settle, NP  hydrOXYzine  (ATARAX/VISTARIL) 10 MG tablet Take 10 mg by mouth 3 (three) times daily as needed.    Historical Provider, MD  ibuprofen (ADVIL,MOTRIN) 600 MG tablet Take 1 tablet (600 mg total) by mouth every 8 (eight) hours as needed for moderate pain. 12/17/14   Julianne Rice, MD  ipratropium (ATROVENT) 0.06 % nasal spray Place 2 sprays into both nostrils 4 (four) times daily. 07/22/16   Lysbeth Penner, FNP  loratadine (CLARITIN) 10 MG tablet Take 10 mg by mouth at bedtime.    Historical Provider, MD  LORazepam (ATIVAN) 1 MG tablet Take 1 tablet (1 mg total) by mouth 2 (two) times daily. 09/20/13   Fransico Meadow, PA-C  metoCLOPramide (REGLAN) 10 MG tablet Take 1 tablet (10 mg total) by mouth 4 (four) times daily. 09/20/13   Fransico Meadow, PA-C  nebivolol (BYSTOLIC) 10 MG tablet Take 10 mg by mouth daily.    Historical Provider, MD  OXcarbazepine (TRILEPTAL) 150 MG tablet Take 150 mg by mouth 2 (two) times daily.    Historical Provider, MD  predniSONE (STERAPRED UNI-PAK 48 TAB) 10 MG (48) TBPK tablet Take by mouth daily. Take daily as directed Patient not taking: Reported on 08/03/2016 05/07/15   Bjorn Pippin, PA-C  UNABLE TO FIND Propanolol    Historical Provider, MD   Meds Ordered and Administered this Visit  Medications - No data to display  BP 149/82 (BP Location: Left Arm) Comment (BP Location): large cuff  Pulse 107   Temp 97.9 F (36.6 C) (Oral)   Resp 22   SpO2 98%  No data found.   Physical Exam  Constitutional: She appears well-developed and well-nourished. No distress.  HENT:  Head: Normocephalic and atraumatic.  Eyes: Conjunctivae are normal.  Neck: Neck supple.  Cardiovascular: Normal rate and regular rhythm.  Exam reveals no gallop and no friction rub.   Pulmonary/Chest: Effort normal and breath sounds normal. No respiratory distress. She has no wheezes. She has no rales. She exhibits no tenderness.  Abdominal: Soft. There is no tenderness.  Musculoskeletal: Normal range of  motion. She exhibits tenderness. She exhibits no edema or deformity.  The patient is able to unable to ambulate and bear weight secondary to reported discomfort.  There is no obvious asymmetry or deformity of the left foot as compared to the right foot. No surface trauma, ecchymosis, erythema, lesions, ulcers or break in skin integrity. No bony step-off or deformity, no tenderness to palpation of her toes, midfoot or hindfoot or sole. Normal plantar/dorsiflexion, inversion/eversion. Distal motor neurovascular status is intact. Pain noted with palpation over posterior tibialis tendons and 5th metatarsal.    Neurological: She is alert. No sensory deficit.  Skin: Skin is warm and dry. Capillary refill takes 2 to 3 seconds.  Psychiatric: She has a normal mood and affect. Her behavior is normal.  Nursing note and vitals reviewed.   Urgent Care Course   Clinical Course     Procedures (including critical care time)  Labs Review Labs Reviewed -  No data to display  Imaging Review Dg Foot Complete Left  Result Date: 08/03/2016 CLINICAL DATA:  Pain after fall EXAM: LEFT FOOT - COMPLETE 3+ VIEW COMPARISON:  None. FINDINGS: There is no evidence of fracture or dislocation. There is no evidence of arthropathy or other focal bone abnormality. Soft tissues are unremarkable. IMPRESSION: Negative. Electronically Signed   By: Dorise Bullion III M.D   On: 08/03/2016 13:02   ASO for left foot/ankle.  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   MDM   1. Sprain of right foot, initial encounter   2. Acute right ankle pain    Meds ordered this encounter  Medications  . HYDROcodone-acetaminophen (NORCO/VICODIN) 5-325 MG tablet    Sig: Take 2 tablets by mouth every 4 (four) hours as needed.    Dispense:  10 tablet    Refill:  0   The usual and customary discharge instructions and warnings were given.  The patient verbalizes understanding and agrees  to plan of care.       Nehemiah Settle, NP 08/03/16 1332

## 2017-04-12 ENCOUNTER — Emergency Department (HOSPITAL_COMMUNITY)
Admission: EM | Admit: 2017-04-12 | Discharge: 2017-04-12 | Disposition: A | Discharge status: Home / Self Care | Payer: Medicaid Other | Attending: Emergency Medicine | Admitting: Emergency Medicine

## 2017-04-12 ENCOUNTER — Encounter (HOSPITAL_COMMUNITY): Payer: Self-pay | Admitting: Emergency Medicine

## 2017-04-12 DIAGNOSIS — Z79899 Other long term (current) drug therapy: Secondary | ICD-10-CM | POA: Insufficient documentation

## 2017-04-12 DIAGNOSIS — K047 Periapical abscess without sinus: Secondary | ICD-10-CM

## 2017-04-12 DIAGNOSIS — R600 Localized edema: Secondary | ICD-10-CM | POA: Diagnosis present

## 2017-04-12 MED ORDER — KETOROLAC TROMETHAMINE 30 MG/ML IJ SOLN
30.0000 mg | Freq: Once | INTRAMUSCULAR | Status: AC
  Administered 2017-04-12: 30 mg via INTRAMUSCULAR
  Filled 2017-04-12: qty 1

## 2017-04-12 MED ORDER — AMOXICILLIN-POT CLAVULANATE 875-125 MG PO TABS: 1.0000 | ORAL_TABLET | Freq: Two times a day (BID) | ORAL | 0 refills | Status: DC

## 2017-04-12 MED ORDER — AMOXICILLIN-POT CLAVULANATE 875-125 MG PO TABS
1.0000 | ORAL_TABLET | Freq: Once | ORAL | Status: AC
  Administered 2017-04-12: 1 via ORAL
  Filled 2017-04-12: qty 1

## 2017-04-12 NOTE — ED Provider Notes (Signed)
[facility] DEPT Provider Note   CSN: [id number] Arrival date & time: 04/12/17  1534     History   Chief Complaint Chief Complaint  Patient presents with  . Facial Swelling    HPI Kellie Fisher is a 36 y.o. female.  HPI   36 year old female presents today with complaints of dental pain.  Patient reports that 2 days ago she had a root canal on the lateral incisor after noting an infection.  She notes she developed swelling after the surgery and pain.  Patient notes she attempted contacting dentist but has been unable to establish contact with them.  Patient denies any fever, nausea or vomiting.  She reports minor swelling to the soft tissue around the tooth.  Patient reports taking ibuprofen and Tylenol at home for discomfort.     Past Medical History:  Diagnosis Date  . Ankle fracture   . Anxiety   . Depression   . Eczema   . Migraine   . PTSD (post-traumatic stress disorder)   . Tumor containing calcium     There are no active problems to display for this patient.   Past Surgical History:  Procedure Laterality Date  . CHOLECYSTECTOMY    . HERNIA REPAIR      OB History    No data available       Home Medications    Prior to Admission medications   Medication Sig Start Date End Date Taking? Authorizing Provider  acetaminophen (TYLENOL) 500 MG tablet Take 1,000 mg by mouth every 6 (six) hours as needed. For pain.    [provider]  ALPRAZolam Duanne Moron) 0.25 MG tablet Take 1 mg by mouth 3 (three) times daily as needed. For anxiety.    [provider]  amoxicillin-clavulanate (AUGMENTIN) 875-125 MG tablet Take 1 tablet by mouth every 12 (twelve) hours. 04/12/17   Florene Brill, Dellis Filbert, PA-C  augmented betamethasone dipropionate (DIPROLENE AF) 0.05 % cream Apply topically 2 (two) times daily. 07/11/13   Harden Mo, MD  benzonatate (TESSALON) 100 MG capsule Take 2 capsules (200 mg total) by mouth 3 (three) times daily as needed for cough.  07/22/16   Lysbeth Penner, FNP  buPROPion (WELLBUTRIN XL) 150 MG 24 hr tablet Take 150 mg by mouth daily.    [provider]  chlorpheniramine-HYDROcodone (TUSSIONEX PENNKINETIC ER) 10-8 MG/5ML LQCR Take 5 mLs by mouth every 12 (twelve) hours as needed for cough. 02/13/14   Nehemiah Settle, NP  citalopram (CELEXA) 40 MG tablet Take 40 mg by mouth daily.    [provider]  clobetasol cream (TEMOVATE) 4.00 % Apply 1 application topically 2 (two) times daily. 05/07/15   Bjorn Pippin, PA-C  doxycycline (VIBRAMYCIN) 100 MG capsule Take 1 capsule (100 mg total) by mouth 2 (two) times daily. Patient not taking: Reported on 08/03/2016 02/13/14   Nehemiah Settle, NP  Escitalopram Oxalate (LEXAPRO PO) Take by mouth.    [provider]  etonogestrel-ethinyl estradiol (NUVARING) 0.12-0.015 MG/24HR vaginal ring Place 1 each vaginally every 28 (twenty-eight) days. Insert vaginally and leave in place for 3 consecutive weeks, then remove for 1 week.    [provider]  Gabapentin (NEURONTIN PO) Take by mouth.    [provider]  HYDROcodone-acetaminophen (NORCO/VICODIN) 5-325 MG tablet Take 2 tablets by mouth every 4 (four) hours as needed. 08/03/16   Nehemiah Settle, NP  hydrOXYzine (ATARAX/VISTARIL) 10 MG tablet Take 10 mg by mouth 3 (three) times daily as needed.  [provider]  ibuprofen (ADVIL,MOTRIN) 600 MG tablet Take 1 tablet (600 mg total) by mouth every 8 (eight) hours as needed for moderate pain. 12/17/14   Julianne Rice, MD  ipratropium (ATROVENT) 0.06 % nasal spray Place 2 sprays into both nostrils 4 (four) times daily. 07/22/16   Lysbeth Penner, FNP  loratadine (CLARITIN) 10 MG tablet Take 10 mg by mouth at bedtime.    [provider]  LORazepam (ATIVAN) 1 MG tablet Take 1 tablet (1 mg total) by mouth 2 (two) times daily. 09/20/13   Fransico Meadow, PA-C  metoCLOPramide (REGLAN) 10 MG tablet Take 1 tablet (10 mg  total) by mouth 4 (four) times daily. 09/20/13   Fransico Meadow, PA-C  nebivolol (BYSTOLIC) 10 MG tablet Take 10 mg by mouth daily.    [provider]  OXcarbazepine (TRILEPTAL) 150 MG tablet Take 150 mg by mouth 2 (two) times daily.    [provider]  predniSONE (STERAPRED UNI-PAK 48 TAB) 10 MG (48) TBPK tablet Take by mouth daily. Take daily as directed Patient not taking: Reported on 08/03/2016 05/07/15   Bjorn Pippin, PA-C  UNABLE TO FIND Propanolol    [provider]    Family History History reviewed. No pertinent family history.  Social History Social History  Substance Use Topics  . Smoking status: Never Smoker  . Smokeless tobacco: Never Used  . Alcohol use No     Comment: occasional     Allergies   Azithromycin and Benzoyl peroxide   Review of Systems Review of Systems  All other systems reviewed and are negative.   Physical Exam Updated Vital Signs BP (!) 150/99   Pulse 100   Temp 99.1 F (37.3 C) (Oral)   Resp 16   SpO2 98%   Physical Exam  Constitutional: She is oriented to person, place, and time. She appears well-developed and well-nourished.  HENT:  Head: Normocephalic and atraumatic.  No obvious swelling to the gumline.  Tenderness to palpation over the right lateral incisor.  Minor swelling to the upper lip and cheek around the incisor with no signs of redness or fluctuance  Eyes: Pupils are equal, round, and reactive to light. Conjunctivae are normal. Right eye exhibits no discharge. Left eye exhibits no discharge. No scleral icterus.  Neck: Normal range of motion. No JVD present. No tracheal deviation present.  Pulmonary/Chest: Effort normal. No stridor.  Neurological: She is alert and oriented to person, place, and time. Coordination normal.  Psychiatric: She has a normal mood and affect. Her behavior is normal. Judgment and thought content normal.  Nursing note and vitals reviewed.    ED Treatments / Results    Labs (all labs ordered are listed, but only abnormal results are displayed) Labs Reviewed - No data to display  EKG  EKG Interpretation None       Radiology No results found.  Procedures Procedures (including critical care time)  Medications Ordered in ED Medications  ketorolac (TORADOL) 30 MG/ML injection 30 mg (30 mg Intramuscular Given 04/12/17 1648)  amoxicillin-clavulanate (AUGMENTIN) 875-125 MG per tablet 1 tablet (1 tablet Oral Given 04/12/17 1806)     Initial Impression / Assessment and Plan / ED Course  I have reviewed the triage vital signs and the nursing notes.  Pertinent labs & imaging results that were available during my care of the patient were reviewed by me and considered in my medical decision making (see chart for details).     Final Clinical  Impressions(s) / ED Diagnoses   Final diagnoses:  Dental infection   Therapeutics: Toradol  Discharge Meds: Augmentin  Assessment/Plan: 36 year old female presents today with complaints of dental pain.  She is status post root canal.  Uncertain if swelling is secondary to recent procedure or developing infection.  She is afebrile here nontoxic.  I will start her on antibiotics, she will be encouraged to continue using Tylenol and ibuprofen as she has a significant past medical history of Vicodin overdose, she will follow-up with her dentist.  She was given strict return precautions, she will return to the emergency room if symptoms continue to worsen or she is unable to be seen by her dentist.  Patient verbalized understanding and agreement to today's plan had no further questions or concerns.    New Prescriptions Discharge Medication List as of 04/12/2017  6:01 PM    START taking these medications   Details  amoxicillin-clavulanate (AUGMENTIN) 875-125 MG tablet Take 1 tablet by mouth every 12 (twelve) hours., Starting Sat 04/12/2017, Print         Nicholas Trompeter, [place], PA-C 04/12/17 1845    Gareth Morgan, MD 04/12/17 [phone]

## 2017-04-12 NOTE — ED Triage Notes (Signed)
Pt had root canal on Thursday.  Pain and swelling continues in face and teeth

## 2017-04-12 NOTE — Discharge Instructions (Signed)
Please take antibiotics as directed.  Please continue using Tylenol or ibuprofen as needed for symptoms.  Please return immediately if any new or worsening signs or symptoms present.  Please contact your dentist, if you are unable to follow-up with your dentist in symptoms or worsening please return to the emergency room for repeat evaluation.

## 2017-04-13 ENCOUNTER — Emergency Department (HOSPITAL_COMMUNITY): Payer: Medicaid Other

## 2017-04-13 ENCOUNTER — Encounter (HOSPITAL_COMMUNITY): Payer: Self-pay | Admitting: Emergency Medicine

## 2017-04-13 ENCOUNTER — Inpatient Hospital Stay (HOSPITAL_COMMUNITY)
Admission: EM | Admit: 2017-04-13 | Discharge: 2017-04-15 | DRG: 158 | Disposition: A | Discharge status: Home / Self Care | Payer: Medicaid Other | Attending: Internal Medicine | Admitting: Internal Medicine

## 2017-04-13 DIAGNOSIS — Z6839 Body mass index (BMI) 39.0-39.9, adult: Secondary | ICD-10-CM

## 2017-04-13 DIAGNOSIS — L03211 Cellulitis of face: Secondary | ICD-10-CM | POA: Diagnosis present

## 2017-04-13 DIAGNOSIS — K122 Cellulitis and abscess of mouth: Principal | ICD-10-CM | POA: Diagnosis present

## 2017-04-13 DIAGNOSIS — F4310 Post-traumatic stress disorder, unspecified: Secondary | ICD-10-CM | POA: Diagnosis present

## 2017-04-13 DIAGNOSIS — F418 Other specified anxiety disorders: Secondary | ICD-10-CM | POA: Diagnosis present

## 2017-04-13 DIAGNOSIS — J45909 Unspecified asthma, uncomplicated: Secondary | ICD-10-CM | POA: Diagnosis present

## 2017-04-13 DIAGNOSIS — Z79899 Other long term (current) drug therapy: Secondary | ICD-10-CM

## 2017-04-13 DIAGNOSIS — K047 Periapical abscess without sinus: Secondary | ICD-10-CM

## 2017-04-13 DIAGNOSIS — E669 Obesity, unspecified: Secondary | ICD-10-CM | POA: Diagnosis present

## 2017-04-13 LAB — BASIC METABOLIC PANEL
Anion gap: 11 (ref 5–15)
BUN: 7 mg/dL (ref 6–20)
CO2: 20 mmol/L — AB (ref 22–32)
Calcium: 8.7 mg/dL — ABNORMAL LOW (ref 8.9–10.3)
Chloride: 105 mmol/L (ref 101–111)
Creatinine, Ser: 0.7 mg/dL (ref 0.44–1.00)
GFR calc non Af Amer: >60 mL/min (ref >60)
Glucose, Bld: 153 mg/dL — ABNORMAL HIGH (ref 65–99)
Potassium: 4.3 mmol/L (ref 3.5–5.1)
Sodium: 136 mmol/L (ref 135–145)

## 2017-04-13 LAB — CBC WITH DIFFERENTIAL/PLATELET
BASOS PCT: 0 %
Basophils Absolute: 0 10*3/uL (ref 0.0–0.1)
Eosinophils Absolute: 0.3 10*3/uL (ref 0.0–0.7)
Eosinophils Relative: 2 %
HEMATOCRIT: 38.2 % (ref 36.0–46.0)
Hemoglobin: 12.5 g/dL (ref 12.0–15.0)
Lymphocytes Relative: 17 %
Lymphs Abs: 2.4 10*3/uL (ref 0.7–4.0)
MCH: 27.1 pg (ref 26.0–34.0)
MCHC: 32.7 g/dL (ref 30.0–36.0)
MCV: 82.9 fL (ref 78.0–100.0)
MONOS PCT: 6 %
Monocytes Absolute: 0.8 10*3/uL (ref 0.1–1.0)
NEUTROS ABS: 10.9 10*3/uL — AB (ref 1.7–7.7)
Neutrophils Relative %: 75 %
Platelets: 297 10*3/uL (ref 150–400)
RBC: 4.61 MIL/uL (ref 3.87–5.11)
RDW: 13.6 % (ref 11.5–15.5)
WBC: 14.4 10*3/uL — ABNORMAL HIGH (ref 4.0–10.5)

## 2017-04-13 LAB — I-STAT BETA HCG BLOOD, ED (MC, WL, AP ONLY): I-stat hCG, quantitative: <5 m[IU]/mL (ref <5)

## 2017-04-13 MED ORDER — TRAMADOL HCL 50 MG PO TABS
50.0000 mg | ORAL_TABLET | Freq: Four times a day (QID) | ORAL | Status: DC | PRN
  Administered 2017-04-14 (×2): 50 mg via ORAL
  Filled 2017-04-13 (×2): qty 1

## 2017-04-13 MED ORDER — ONDANSETRON HCL 4 MG PO TABS: 4.0000 mg | ORAL_TABLET | Freq: Four times a day (QID) | ORAL | Status: DC | PRN

## 2017-04-13 MED ORDER — ESCITALOPRAM OXALATE 10 MG PO TABS
20.0000 mg | ORAL_TABLET | Freq: Every day | ORAL | Status: DC
  Administered 2017-04-13 – 2017-04-15 (×3): 20 mg via ORAL
  Filled 2017-04-13 (×3): qty 2

## 2017-04-13 MED ORDER — SODIUM CHLORIDE 0.9 % IV SOLN
INTRAVENOUS | Status: DC
  Administered 2017-04-13 – 2017-04-14 (×2): via INTRAVENOUS

## 2017-04-13 MED ORDER — IOPAMIDOL (ISOVUE-300) INJECTION 61%
INTRAVENOUS | Status: AC
  Filled 2017-04-13: qty 75

## 2017-04-13 MED ORDER — GABAPENTIN 300 MG PO CAPS
300.0000 mg | ORAL_CAPSULE | Freq: Three times a day (TID) | ORAL | Status: DC
  Administered 2017-04-13 – 2017-04-15 (×7): 300 mg via ORAL
  Filled 2017-04-13 (×7): qty 1

## 2017-04-13 MED ORDER — DEXAMETHASONE SODIUM PHOSPHATE 10 MG/ML IJ SOLN
10.0000 mg | Freq: Once | INTRAMUSCULAR | Status: AC
  Administered 2017-04-13: 10 mg via INTRAVENOUS
  Filled 2017-04-13: qty 1

## 2017-04-13 MED ORDER — MONTELUKAST SODIUM 10 MG PO TABS
10.0000 mg | ORAL_TABLET | Freq: Every day | ORAL | Status: DC
  Administered 2017-04-13 – 2017-04-15 (×3): 10 mg via ORAL
  Filled 2017-04-13 (×3): qty 1

## 2017-04-13 MED ORDER — FENTANYL CITRATE (PF) 100 MCG/2ML IJ SOLN
25.0000 ug | Freq: Once | INTRAMUSCULAR | Status: AC
  Administered 2017-04-13: 25 ug via INTRAVENOUS
  Filled 2017-04-13: qty 2

## 2017-04-13 MED ORDER — SODIUM CHLORIDE 0.9% FLUSH: 3.0000 mL | INTRAVENOUS | Status: DC | PRN

## 2017-04-13 MED ORDER — ACETAMINOPHEN 325 MG PO TABS
650.0000 mg | ORAL_TABLET | Freq: Four times a day (QID) | ORAL | Status: DC | PRN
  Administered 2017-04-14 – 2017-04-15 (×2): 650 mg via ORAL
  Filled 2017-04-13 (×2): qty 2

## 2017-04-13 MED ORDER — IOPAMIDOL (ISOVUE-300) INJECTION 61%
75.0000 mL | Freq: Once | INTRAVENOUS | Status: AC | PRN
  Administered 2017-04-13: 75 mL via INTRAVENOUS

## 2017-04-13 MED ORDER — SODIUM CHLORIDE 0.9% FLUSH
3.0000 mL | Freq: Two times a day (BID) | INTRAVENOUS | Status: DC
  Administered 2017-04-13 (×2): 3 mL via INTRAVENOUS

## 2017-04-13 MED ORDER — ACETAMINOPHEN 650 MG RE SUPP: 650.0000 mg | Freq: Four times a day (QID) | RECTAL | Status: DC | PRN

## 2017-04-13 MED ORDER — SODIUM CHLORIDE 0.9 % IV SOLN: 250.0000 mL | INTRAVENOUS | Status: DC | PRN

## 2017-04-13 MED ORDER — HYDRALAZINE HCL 20 MG/ML IJ SOLN
10.0000 mg | INTRAMUSCULAR | Status: DC | PRN
  Filled 2017-04-13: qty 0.5

## 2017-04-13 MED ORDER — KETOROLAC TROMETHAMINE 30 MG/ML IJ SOLN
30.0000 mg | Freq: Four times a day (QID) | INTRAMUSCULAR | Status: DC | PRN
  Administered 2017-04-13 – 2017-04-15 (×5): 30 mg via INTRAVENOUS
  Filled 2017-04-13 (×5): qty 1

## 2017-04-13 MED ORDER — SODIUM CHLORIDE 0.9 % IV SOLN
3.0000 g | Freq: Four times a day (QID) | INTRAVENOUS | Status: DC
  Administered 2017-04-13 – 2017-04-15 (×8): 3 g via INTRAVENOUS
  Filled 2017-04-13 (×11): qty 3

## 2017-04-13 MED ORDER — ONDANSETRON HCL 4 MG/2ML IJ SOLN
4.0000 mg | Freq: Once | INTRAMUSCULAR | Status: AC
  Administered 2017-04-13: 4 mg via INTRAVENOUS
  Filled 2017-04-13: qty 2

## 2017-04-13 MED ORDER — ONDANSETRON HCL 4 MG/2ML IJ SOLN: 4.0000 mg | Freq: Four times a day (QID) | INTRAMUSCULAR | Status: DC | PRN

## 2017-04-13 MED ORDER — LORATADINE 10 MG PO TABS
10.0000 mg | ORAL_TABLET | Freq: Every day | ORAL | Status: DC
  Administered 2017-04-13 – 2017-04-14 (×2): 10 mg via ORAL
  Filled 2017-04-13 (×2): qty 1

## 2017-04-13 MED ORDER — ENOXAPARIN SODIUM 80 MG/0.8ML ~~LOC~~ SOLN
70.0000 mg | SUBCUTANEOUS | Status: DC
  Administered 2017-04-13 – 2017-04-15 (×3): 70 mg via SUBCUTANEOUS
  Filled 2017-04-13 (×3): qty 0.8

## 2017-04-13 MED ORDER — CLINDAMYCIN PHOSPHATE 600 MG/50ML IV SOLN
600.0000 mg | Freq: Once | INTRAVENOUS | Status: AC
  Administered 2017-04-13: 600 mg via INTRAVENOUS
  Filled 2017-04-13: qty 50

## 2017-04-13 MED ORDER — KETOROLAC TROMETHAMINE 30 MG/ML IJ SOLN
30.0000 mg | Freq: Once | INTRAMUSCULAR | Status: AC
  Administered 2017-04-13: 30 mg via INTRAVENOUS
  Filled 2017-04-13: qty 1

## 2017-04-13 MED ORDER — ALBUTEROL SULFATE (2.5 MG/3ML) 0.083% IN NEBU: 2.5000 mg | INHALATION_SOLUTION | Freq: Four times a day (QID) | RESPIRATORY_TRACT | Status: DC | PRN

## 2017-04-13 MED ORDER — HYDROXYZINE HCL 10 MG PO TABS
10.0000 mg | ORAL_TABLET | Freq: Three times a day (TID) | ORAL | Status: DC | PRN
  Administered 2017-04-13: 10 mg via ORAL
  Filled 2017-04-13: qty 1

## 2017-04-13 NOTE — Progress Notes (Signed)
Patient seen and examined.  She report improvement of facial swelling  She denies dyspnea.  Exam; face; with less edema, redness right side.  Lungs; CTA  1-Facial cellulitis. Odontogenic.  Improving. Will continue with IV antibiotics.  Needs to follow up with her dentist   2-asthma; nebulizer PRN.  No wheezing on lung exam.   Niel Hummer, MD.

## 2017-04-13 NOTE — H&P (Signed)
History and Physical    Kellie Fisher JIR:[id number] DOB: [birth year] DOA: 04/13/2017  PCP: Haywood Pao, MD   Patient coming from: Home   Chief Complaint: Facial swelling and pain   HPI: Kellie Fisher is a 36 y.o. female with medical history significant for depression, anxiety, and obesity, who presented to the emergency department for evaluation of facial swelling and pain. Patient reports that she underwent a root canal on 04/10/2017, and over the ensuing days has developed increasing swelling and pain at the right face. She was seen in the emergency department for this yesterday, appeared to be stable at that time, and was discharged home with Augmentin. She now returns with increased swelling and pain, but continues to deny fevers or chills. Pain and swelling is centered around the tooth that is status post recent root canal. Denies any purulent drainage, shortness of breath, or noisy breathing.  ED Course: Upon arrival to the ED, patient is found to be afebrile, saturating well on room air, and with vital signs stable. Chemistry panel is notable for a glucose of 153 and bicarbonate of 20. CBC features a leukocytosis to 14,400. Contrast-enhanced CT of the neck soft tissues notable for soft tissue swelling and inflammation about the tooth where the root canal was performed, with no abscess or drainable fluid collection. Patient was treated with clindamycin and fentanyl in the emergency department. She remained afebrile, hemodynamically stable, and in no apparent respiratory distress, and will be observed on the medical/surgical unit for ongoing evaluation and management of odontogenic cellulitis.  Review of Systems:  All other systems reviewed and apart from HPI, are negative.  Past Medical History:  Diagnosis Date  . Ankle fracture   . Anxiety   . Depression   . Eczema   . Migraine   . PTSD (post-traumatic stress disorder)   . Tumor containing calcium     Past Surgical  History:  Procedure Laterality Date  . CHOLECYSTECTOMY    . HERNIA REPAIR       reports that she has never smoked. She has never used smokeless tobacco. She reports that she does not drink alcohol or use drugs.  Allergies  Allergen Reactions  . Azithromycin Other (See Comments)    Makes her crazy   . Benzoyl Peroxide     History reviewed. No pertinent family history.   Prior to Admission medications   Medication Sig Start Date End Date Taking? Authorizing Provider  acetaminophen (TYLENOL) 500 MG tablet Take 1,000 mg by mouth every 6 (six) hours as needed. For pain.   Yes [provider]  albuterol (PROAIR HFA) 108 (90 Base) MCG/ACT inhaler Inhale 2 puffs into the lungs every 6 (six) hours as needed for wheezing or shortness of breath.    Yes [provider]  amoxicillin-clavulanate (AUGMENTIN) 875-125 MG tablet Take 1 tablet by mouth every 12 (twelve) hours. 04/12/17  Yes Hedges, Dellis Filbert, PA-C  BYDUREON 2 MG PEN Inject 2 mg into the skin once a week. 02/28/17  Yes [provider]  escitalopram (LEXAPRO) 20 MG tablet Take 20 mg by mouth daily. 03/30/17  Yes [provider]  etonogestrel-ethinyl estradiol (NUVARING) 0.12-0.015 MG/24HR vaginal ring Place 1 each vaginally every 28 (twenty-eight) days. Insert vaginally and leave in place for 3 consecutive weeks, then remove for 1 week.   Yes [provider]  gabapentin (NEURONTIN) 300 MG capsule Take 300 mg by mouth 3 (three) times daily. 03/11/17  Yes [provider]  hydrOXYzine (ATARAX/VISTARIL) 10  MG tablet Take 10 mg by mouth 3 (three) times daily as needed for itching or anxiety.    Yes [provider]  loratadine (CLARITIN) 10 MG tablet Take 10 mg by mouth at bedtime.   Yes [provider]  montelukast (SINGULAIR) 10 MG tablet Take 10 mg by mouth daily. 03/30/17  Yes [provider]    Physical Exam: Vitals:   04/13/17 0221  BP: (!) 162/108  Pulse: 97    Resp: 16  Temp: 98.6 F (37 C)  TempSrc: Oral  SpO2: 97%  Weight: 117.9 kg (260 lb)  Height: 5' 8"  (1.727 m)      Constitutional: No acute distress, calm, appears uncomfortable.  Eyes: PERTLA, lids and conjunctivae normal ENMT: Mucous membranes are moist. Posterior pharynx clear of any exudate or lesions. Right facial swelling.   Neck: normal, supple, no masses, no thyromegaly Respiratory: clear to auscultation bilaterally, no wheezing, no crackles. Normal respiratory effort.    Cardiovascular: S1 & S2 heard, regular rate and rhythm. No significant JVD. Abdomen: No distension, no tenderness, no masses palpated. Bowel sounds normal.  Musculoskeletal: no clubbing / cyanosis. No joint deformity upper and lower extremities.  Skin: no significant rashes, lesions, ulcers. Warm, dry, well-perfused. Neurologic: CN 2-12 grossly intact. Sensation intact, DTR normal. Strength 5/5 in all 4 limbs.  Psychiatric: Alert and oriented x 3. Calm and cooperative.     Labs on Admission: I have personally reviewed following labs and imaging studies  CBC:  Recent Labs Lab 04/13/17 0315  WBC 14.4*  NEUTROABS 10.9*  HGB 12.5  HCT 38.2  MCV 82.9  PLT 106   Basic Metabolic Panel:  Recent Labs Lab 04/13/17 0315  NA 136  K 4.3  CL 105  CO2 20*  GLUCOSE 153*  BUN 7  CREATININE 0.70  CALCIUM 8.7*   GFR: Estimated Creatinine Clearance: 131.2 mL/min (by C-G formula based on SCr of 0.7 mg/dL). Liver Function Tests: No results for input(s): AST, ALT, ALKPHOS, BILITOT, PROT, ALBUMIN in the last 168 hours. No results for input(s): LIPASE, AMYLASE in the last 168 hours. No results for input(s): AMMONIA in the last 168 hours. Coagulation Profile: No results for input(s): INR, PROTIME in the last 168 hours. Cardiac Enzymes: No results for input(s): CKTOTAL, CKMB, CKMBINDEX, TROPONINI in the last 168 hours. BNP (last 3 results) No results for input(s): PROBNP in the last 8760  hours. HbA1C: No results for input(s): HGBA1C in the last 72 hours. CBG: No results for input(s): GLUCAP in the last 168 hours. Lipid Profile: No results for input(s): CHOL, HDL, LDLCALC, TRIG, CHOLHDL, LDLDIRECT in the last 72 hours. Thyroid Function Tests: No results for input(s): TSH, T4TOTAL, FREET4, T3FREE, THYROIDAB in the last 72 hours. Anemia Panel: No results for input(s): VITAMINB12, FOLATE, FERRITIN, TIBC, IRON, RETICCTPCT in the last 72 hours. Urine analysis:    Component Value Date/Time   COLORURINE YELLOW 09/20/2013 1855   APPEARANCEUR CLEAR 09/20/2013 1855   LABSPEC 1.021 09/20/2013 1855   PHURINE 6.0 09/20/2013 1855   GLUCOSEU NEGATIVE 09/20/2013 1855   HGBUR NEGATIVE 09/20/2013 1855   BILIRUBINUR NEGATIVE 09/20/2013 1855   KETONESUR NEGATIVE 09/20/2013 1855   PROTEINUR NEGATIVE 09/20/2013 1855   UROBILINOGEN 0.2 09/20/2013 1855   NITRITE NEGATIVE 09/20/2013 1855   LEUKOCYTESUR NEGATIVE 09/20/2013 1855   Sepsis Labs: @LABRCNTIP (procalcitonin:4,lacticidven:4) )No results found for this or any previous visit (from the past 240 hour(s)).   Radiological Exams on Admission: Ct Soft Tissue Neck W Contrast  Result Date:  04/13/2017 CLINICAL DATA:  Right-sided facial swelling following root canal. EXAM: CT NECK WITH CONTRAST TECHNIQUE: Multidetector CT imaging of the neck was performed using the standard protocol following the bolus administration of intravenous contrast. CONTRAST:  42m ISOVUE-300 IOPAMIDOL (ISOVUE-300) INJECTION 61% COMPARISON:  None. FINDINGS: Pharynx and larynx: --Nasopharynx: Fossae of Rosenmuller are clear. Normal adenoid tonsils for age. --Oral cavity and oropharynx: There are findings of prior root canal at tooth 7 with a small periapical lucency. There is soft tissue swelling and inflammation overlying the site. No abscess or drainable fluid collection. There are scattered prominent level 1 lymph nodes measuring up to 1.1 cm on the right.  --Hypopharynx: Normal vallecula and pyriform sinuses. --Larynx: Normal epiglottis and pre-epiglottic space. Normal aryepiglottic and vocal folds. --Retropharyngeal space: No abscess, effusion or lymphadenopathy. Salivary glands: --Parotid: No mass lesion or inflammation. No sialolithiasis or ductal dilatation. --Submandibular: Symmetric without inflammation. No sialolithiasis or ductal dilatation. --Sublingual: Normal. No ranula or other visible lesion of the base of tongue and floor of mouth. Thyroid: Normal. Lymph nodes: Bilateral prominent level 1 lymph nodes, measuring up to 11 mm on the right. No lower cervical lymphadenopathy. Vascular: Major cervical vessels are patent. Limited intracranial: Normal. Visualized orbits: Normal. Mastoids and visualized paranasal sinuses: No fluid levels or advanced mucosal thickening. No mastoid effusion. Skeleton: No bony spinal canal stenosis. No lytic or blastic lesions. Upper chest: Clear. Other: None. IMPRESSION: 1. Small periapical lucency at the root of tooth 7 with overlying soft tissue swelling and inflammation consistent with odontogenic cellulitis. No abscess or drainable fluid collection. 2. Reactive level 1 lymphadenopathy. Electronically Signed   By: KUlyses JarredM.D.   On: 04/13/2017 05:24    EKG: Not performed.   Assessment/Plan  1. Facial cellulitis, odontogenic  - Pt underwent root canal on 04/10/17 and has developed increasing pain and swelling since  - Seen in ED the day prior to admission, had minimal swelling only, and was discharged home with Augmentin  - She returns with increased swelling as documented with pictures in chart, and increased pain reports  - No fevers, but has a leukocytosis  - CT with swelling and inflammation about the site of the root canal consistent with odontogenic cellulitis; no abscess or fluid collection identified  - She was treated with clindamycin and analgesia in ED   - Given fairly rapid worsening despite the  oral antibiotic, plan to observe in hospital for initial treatment with IV abx; will use Unasyn 3 g q6h    2. Depression, anxiety  - Continue Lexapro     DVT prophylaxis: sq Lovenox  Code Status: Full  Family Communication: Significant other updated at bedside Disposition Plan: Observe on med-surg Consults called: None Admission status: Observation     TVianne Bulls MD [facility] Pager [phone] If 7PM-7AM, please contact night-coverage [email or web address] Password [email or web address] 04/13/2017, 5:51 AM

## 2017-04-13 NOTE — ED Triage Notes (Signed)
Pt reports having root canal on Thursday and for the last several days facial swelling to the right side of face has worsened.

## 2017-04-13 NOTE — ED Provider Notes (Signed)
[facility] DEPT Provider Note   CSN: [id number] Arrival date & time: 04/13/17  0215    History   Chief Complaint Chief Complaint  Patient presents with  . Facial Swelling    HPI Kellie Fisher is a 36 y.o. female.  36 year old female presents to the emergency department for evaluation of progressive facial swelling. She was seen in the emergency department at 1800 yesterday for similar symptoms. She notes worsening swelling since discharge. Onset of swelling associated with a root canal 3 days ago. Patient was started on Augmentin yesterday. She reports constant, worsening pain. She has noticed difficulty opening her right eye due to swelling. No associated fevers or drainage in the mouth. No difficulty opening her jaw. She has been taking Tylenol and ibuprofen without relief of pain.  Dentist - Dr. Benna Dunks     Past Medical History:  Diagnosis Date  . Ankle fracture   . Anxiety   . Depression   . Eczema   . Migraine   . PTSD (post-traumatic stress disorder)   . Tumor containing calcium     There are no active problems to display for this patient.   Past Surgical History:  Procedure Laterality Date  . CHOLECYSTECTOMY    . HERNIA REPAIR      OB History    No data available       Home Medications    Prior to Admission medications   Medication Sig Start Date End Date Taking? Authorizing Provider  acetaminophen (TYLENOL) 500 MG tablet Take 1,000 mg by mouth every 6 (six) hours as needed. For pain.   Yes [provider]  albuterol (PROAIR HFA) 108 (90 Base) MCG/ACT inhaler Inhale 2 puffs into the lungs every 6 (six) hours as needed for wheezing or shortness of breath.    Yes [provider]  amoxicillin-clavulanate (AUGMENTIN) 875-125 MG tablet Take 1 tablet by mouth every 12 (twelve) hours. 04/12/17  Yes Hedges, Dellis Filbert, PA-C  BYDUREON 2 MG PEN Inject 2 mg into the skin once a week. 02/28/17  Yes [provider]  escitalopram  (LEXAPRO) 20 MG tablet Take 20 mg by mouth daily. 03/30/17  Yes [provider]  etonogestrel-ethinyl estradiol (NUVARING) 0.12-0.015 MG/24HR vaginal ring Place 1 each vaginally every 28 (twenty-eight) days. Insert vaginally and leave in place for 3 consecutive weeks, then remove for 1 week.   Yes [provider]  gabapentin (NEURONTIN) 300 MG capsule Take 300 mg by mouth 3 (three) times daily. 03/11/17  Yes [provider]  hydrOXYzine (ATARAX/VISTARIL) 10 MG tablet Take 10 mg by mouth 3 (three) times daily as needed for itching or anxiety.    Yes [provider]  loratadine (CLARITIN) 10 MG tablet Take 10 mg by mouth at bedtime.   Yes [provider]  montelukast (SINGULAIR) 10 MG tablet Take 10 mg by mouth daily. 03/30/17  Yes [provider]  augmented betamethasone dipropionate (DIPROLENE AF) 0.05 % cream Apply topically 2 (two) times daily. Patient not taking: Reported on 04/13/2017 07/11/13   Harden Mo, MD  clobetasol cream (TEMOVATE) 1.60 % Apply 1 application topically 2 (two) times daily. Patient not taking: Reported on 04/13/2017 05/07/15   Bjorn Pippin, PA-C  ipratropium (ATROVENT) 0.06 % nasal spray Place 2 sprays into both nostrils 4 (four) times daily. Patient not taking: Reported on 04/13/2017 07/22/16   Lysbeth Penner, FNP  predniSONE (STERAPRED UNI-PAK 48 TAB) 10 MG (48) TBPK tablet Take by mouth daily. Take daily  as directed Patient not taking: Reported on 08/03/2016 05/07/15   Prudencio Pair    Family History History reviewed. No pertinent family history.  Social History Social History  Substance Use Topics  . Smoking status: Never Smoker  . Smokeless tobacco: Never Used  . Alcohol use No     Comment: occasional     Allergies   Azithromycin and Benzoyl peroxide   Review of Systems Review of Systems Ten systems reviewed and are negative for acute change, except as noted in the HPI.    Physical  Exam Updated Vital Signs BP (!) 162/108 (BP Location: Left Arm)   Pulse 97   Temp 98.6 F (37 C) (Oral)   Resp 16   Ht 5' 8"  (1.727 m)   Wt 117.9 kg (260 lb)   SpO2 97%   BMI 39.53 kg/m   Physical Exam  Constitutional: She is oriented to person, place, and time. She appears well-developed and well-nourished. No distress.  Nontoxic and in NAD  HENT:  Head: Normocephalic and atraumatic.  Facial swelling extending from the upper dentition to the base of the right eye with associated blepharitis. No distinct abscess noted. No trismus. Soft oral floor.  Eyes: Conjunctivae and EOM are normal. No scleral icterus.  Neck: Normal range of motion.  Cardiovascular: Normal rate, regular rhythm and intact distal pulses.   Pulmonary/Chest: Effort normal. No respiratory distress.  Respirations even and unlabored  Musculoskeletal: Normal range of motion.  Neurological: She is alert and oriented to person, place, and time. She exhibits normal muscle tone. Coordination normal.  GCS 15. Patient moving all extremities.  Skin: Skin is warm and dry. No rash noted. She is not diaphoretic. No erythema. No pallor.  Psychiatric: She has a normal mood and affect. Her behavior is normal.  Nursing note and vitals reviewed.    ED Treatments / Results  Labs (all labs ordered are listed, but only abnormal results are displayed) Labs Reviewed  CBC WITH DIFFERENTIAL/PLATELET - Abnormal; Notable for the following:       Result Value   WBC 14.4 (*)    Neutro Abs 10.9 (*)    All other components within normal limits  BASIC METABOLIC PANEL - Abnormal; Notable for the following:    CO2 20 (*)    Glucose, Bld 153 (*)    Calcium 8.7 (*)    All other components within normal limits  I-STAT BETA HCG BLOOD, ED ([facility], [facility], [facility] ONLY)    EKG  EKG Interpretation None       Radiology Ct Soft Tissue Neck W Contrast  Result Date: 04/13/2017 CLINICAL DATA:  Right-sided facial swelling following root canal. EXAM:  CT NECK WITH CONTRAST TECHNIQUE: Multidetector CT imaging of the neck was performed using the standard protocol following the bolus administration of intravenous contrast. CONTRAST:  8m ISOVUE-300 IOPAMIDOL (ISOVUE-300) INJECTION 61% COMPARISON:  None. FINDINGS: Pharynx and larynx: --Nasopharynx: Fossae of Rosenmuller are clear. Normal adenoid tonsils for age. --Oral cavity and oropharynx: There are findings of prior root canal at tooth 7 with a small periapical lucency. There is soft tissue swelling and inflammation overlying the site. No abscess or drainable fluid collection. There are scattered prominent level 1 lymph nodes measuring up to 1.1 cm on the right. --Hypopharynx: Normal vallecula and pyriform sinuses. --Larynx: Normal epiglottis and pre-epiglottic space. Normal aryepiglottic and vocal folds. --Retropharyngeal space: No abscess, effusion or lymphadenopathy. Salivary glands: --Parotid: No mass lesion or inflammation. No sialolithiasis or ductal dilatation. --Submandibular: Symmetric  without inflammation. No sialolithiasis or ductal dilatation. --Sublingual: Normal. No ranula or other visible lesion of the base of tongue and floor of mouth. Thyroid: Normal. Lymph nodes: Bilateral prominent level 1 lymph nodes, measuring up to 11 mm on the right. No lower cervical lymphadenopathy. Vascular: Major cervical vessels are patent. Limited intracranial: Normal. Visualized orbits: Normal. Mastoids and visualized paranasal sinuses: No fluid levels or advanced mucosal thickening. No mastoid effusion. Skeleton: No bony spinal canal stenosis. No lytic or blastic lesions. Upper chest: Clear. Other: None. IMPRESSION: 1. Small periapical lucency at the root of tooth 7 with overlying soft tissue swelling and inflammation consistent with odontogenic cellulitis. No abscess or drainable fluid collection. 2. Reactive level 1 lymphadenopathy. Electronically Signed   By: Ulyses Jarred M.D.   On: 04/13/2017 05:24     Procedures Procedures (including critical care time)  Medications Ordered in ED Medications  iopamidol (ISOVUE-300) 61 % injection (not administered)  clindamycin (CLEOCIN) IVPB 600 mg (0 mg Intravenous Stopped 04/13/17 0409)  ketorolac (TORADOL) 30 MG/ML injection 30 mg (30 mg Intravenous Given 04/13/17 0316)  dexamethasone (DECADRON) injection 10 mg (10 mg Intravenous Given 04/13/17 0318)  iopamidol (ISOVUE-300) 61 % injection 75 mL (75 mLs Intravenous Contrast Given 04/13/17 0456)  fentaNYL (SUBLIMAZE) injection 25 mcg (25 mcg Intravenous Given 04/13/17 0520)  ondansetron (ZOFRAN) injection 4 mg (4 mg Intravenous Given 04/13/17 0516)     ^6pm on 04/12/17   ^3AM on 04/13/17   Initial Impression / Assessment and Plan / ED Course  I have reviewed the triage vital signs and the nursing notes.  Pertinent labs & imaging results that were available during my care of the patient were reviewed by me and considered in my medical decision making (see chart for details).     36 year old female presents for right sided facial swelling. Symptom onset associated with a recurrent canal this past Thursday. She was seen earlier today for these complaints and discharged on a course of Augmentin. Over the 9 hours since discharge, right-sided facial swelling has significantly worsened. Patient does have a leukocytosis of ~14. No fevers documented. CT obtained which shows cellulitis without drainable abscess. Symptoms managed in the ED with IV clindamycin, decadron, and Toradol.   Plan for admission for additional IV antibiotics. Patient amenable to observation admission. She reported some difficulty breathing on arrival secondary to swelling. This has improved since arrival. Patient remains able to tolerate secretions; juice given.   Case discussed with Dr. Myna Hidalgo of [facility] who will admit.   Final Clinical Impressions(s) / ED Diagnoses   Final diagnoses:  Facial cellulitis  Dental infection    New  Prescriptions New Prescriptions   No medications on file     Antonietta Breach, PA-C 04/13/17 [place], [place], DO 04/13/17 0600

## 2017-04-14 DIAGNOSIS — Z79899 Other long term (current) drug therapy: Secondary | ICD-10-CM | POA: Diagnosis not present

## 2017-04-14 DIAGNOSIS — L03211 Cellulitis of face: Secondary | ICD-10-CM | POA: Diagnosis present

## 2017-04-14 DIAGNOSIS — M7989 Other specified soft tissue disorders: Secondary | ICD-10-CM | POA: Diagnosis present

## 2017-04-14 DIAGNOSIS — F418 Other specified anxiety disorders: Secondary | ICD-10-CM | POA: Diagnosis present

## 2017-04-14 DIAGNOSIS — E669 Obesity, unspecified: Secondary | ICD-10-CM | POA: Diagnosis present

## 2017-04-14 DIAGNOSIS — J45909 Unspecified asthma, uncomplicated: Secondary | ICD-10-CM | POA: Diagnosis present

## 2017-04-14 DIAGNOSIS — F4310 Post-traumatic stress disorder, unspecified: Secondary | ICD-10-CM | POA: Diagnosis present

## 2017-04-14 DIAGNOSIS — K122 Cellulitis and abscess of mouth: Secondary | ICD-10-CM | POA: Diagnosis not present

## 2017-04-14 DIAGNOSIS — Z6839 Body mass index (BMI) 39.0-39.9, adult: Secondary | ICD-10-CM | POA: Diagnosis not present

## 2017-04-14 LAB — BASIC METABOLIC PANEL
Anion gap: 9 (ref 5–15)
BUN: 9 mg/dL (ref 6–20)
CHLORIDE: 104 mmol/L (ref 101–111)
CO2: 25 mmol/L (ref 22–32)
CREATININE: 0.66 mg/dL (ref 0.44–1.00)
Calcium: 8.5 mg/dL — ABNORMAL LOW (ref 8.9–10.3)
GFR calc Af Amer: >60 mL/min (ref >60)
GLUCOSE: 153 mg/dL — AB (ref 65–99)
Potassium: 3.9 mmol/L (ref 3.5–5.1)
SODIUM: 138 mmol/L (ref 135–145)

## 2017-04-14 LAB — CBC WITH DIFFERENTIAL/PLATELET
Basophils Absolute: 0 10*3/uL (ref 0.0–0.1)
Basophils Relative: 0 %
EOS ABS: 0.1 10*3/uL (ref 0.0–0.7)
EOS PCT: 1 %
HCT: 36.1 % (ref 36.0–46.0)
Hemoglobin: 11.7 g/dL — ABNORMAL LOW (ref 12.0–15.0)
LYMPHS ABS: 3.2 10*3/uL (ref 0.7–4.0)
LYMPHS PCT: 22 %
MCH: 27 pg (ref 26.0–34.0)
MCHC: 32.4 g/dL (ref 30.0–36.0)
MCV: 83.4 fL (ref 78.0–100.0)
MONO ABS: 0.9 10*3/uL (ref 0.1–1.0)
Monocytes Relative: 6 %
Neutro Abs: 10.7 10*3/uL — ABNORMAL HIGH (ref 1.7–7.7)
Neutrophils Relative %: 71 %
PLATELETS: 319 10*3/uL (ref 150–400)
RBC: 4.33 MIL/uL (ref 3.87–5.11)
RDW: 13.7 % (ref 11.5–15.5)
WBC: 14.9 10*3/uL — AB (ref 4.0–10.5)

## 2017-04-14 MED ORDER — CHLORHEXIDINE GLUCONATE 0.12 % MT SOLN
15.0000 mL | Freq: Two times a day (BID) | OROMUCOSAL | Status: DC
  Administered 2017-04-14 – 2017-04-15 (×3): 15 mL via OROMUCOSAL
  Filled 2017-04-14 (×3): qty 15

## 2017-04-14 NOTE — Progress Notes (Signed)
PROGRESS NOTE    Kellie Fisher  PIR:[id number] DOB: 1981/07/21 DOA: 04/13/2017 PCP: Haywood Pao, MD   Brief Narrative: Kellie Fisher is a 36 y.o. female with medical history significant for depression, anxiety, and obesity, who presented to the emergency department for evaluation of facial swelling and pain. Patient reports that she underwent a root canal on 04/10/2017, and over the ensuing days has developed increasing swelling and pain at the right face. She was seen in the emergency department for this yesterday, appeared to be stable at that time, and was discharged home with Augmentin. She now returns with increased swelling and pain, but continues to deny fevers or chills. Pain and swelling is centered around the tooth that is status post recent root canal. Denies any purulent drainage, shortness of breath, or noisy breathing.   Assessment & Plan:   Principal Problem:   Cellulitis of oral soft tissues Active Problems:   Depression with anxiety  1-Facial cellulitis. Odontogenic.  Improving. Will continue with IV antibiotics.  Needs to follow up with her dentist  WBC still elevated, would continue with IV antibiotics.  Oral care.   2-Asthma; nebulizer PRN.  No wheezing on lung exam.     DVT prophylaxis: lovenox Code Status: full code.  Family Communication: Mother at bedside.  Disposition Plan: home in 24 hours.    Consultants:   none   Procedures: none   Antimicrobials:  Unasyn    Subjective: Swelling has decrease, able to ope eye better.    Objective: Vitals:   04/13/17 1400 04/13/17 2134 04/14/17 0510 04/14/17 1324  BP: (!) 142/82 (!) 156/94 127/89 137/80  Pulse: (!) 120 (!) 107 (!) 103 75  Resp:  16 18 18   Temp: 98.4 F (36.9 C) 98.7 F (37.1 C) 98.1 F (36.7 C) 98.3 F (36.8 C)  TempSrc: Oral Oral Oral Oral  SpO2: 98% 96% 95% 98%  Weight:      Height:        Intake/Output Summary (Last 24 hours) at 04/14/17 1331 Last data  filed at 04/14/17 1000  Gross per 24 hour  Intake             1445 ml  Output                0 ml  Net             1445 ml   Filed Weights   04/13/17 0221 04/13/17 0656  Weight: 117.9 kg (260 lb) (!) 141.5 kg (311 lb 15.2 oz)    Examination:  General exam: Appears calm and comfortable  Respiratory system: Clear to auscultation. Respiratory effort normal. Cardiovascular system: S1 & S2 heard, RRR. No JVD, murmurs, rubs, gallops or clicks. No pedal edema. Gastrointestinal system: Abdomen is nondistended, soft and nontender. No organomegaly or masses felt. Normal bowel sounds heard. Central nervous system: Alert and oriented. No focal neurological deficits. Extremities: Symmetric 5 x 5 power. Skin: left side of face with less edema.  Psychiatry: Judgement and insight appear normal. Mood & affect appropriate.     Data Reviewed: I have personally reviewed following labs and imaging studies  CBC:  Recent Labs Lab 04/13/17 0315 04/14/17 0519  WBC 14.4* 14.9*  NEUTROABS 10.9* 10.7*  HGB 12.5 11.7*  HCT 38.2 36.1  MCV 82.9 83.4  PLT 297 630   Basic Metabolic Panel:  Recent Labs Lab 04/13/17 0315 04/14/17 0519  NA 136 138  K 4.3 3.9  CL 105 104  CO2  20* 25  GLUCOSE 153* 153*  BUN 7 9  CREATININE 0.70 0.66  CALCIUM 8.7* 8.5*   GFR: Estimated Creatinine Clearance: 136.3 mL/min (by C-G formula based on SCr of 0.66 mg/dL). Liver Function Tests: No results for input(s): AST, ALT, ALKPHOS, BILITOT, PROT, ALBUMIN in the last 168 hours. No results for input(s): LIPASE, AMYLASE in the last 168 hours. No results for input(s): AMMONIA in the last 168 hours. Coagulation Profile: No results for input(s): INR, PROTIME in the last 168 hours. Cardiac Enzymes: No results for input(s): CKTOTAL, CKMB, CKMBINDEX, TROPONINI in the last 168 hours. BNP (last 3 results) No results for input(s): PROBNP in the last 8760 hours. HbA1C: No results for input(s): HGBA1C in the last 72  hours. CBG: No results for input(s): GLUCAP in the last 168 hours. Lipid Profile: No results for input(s): CHOL, HDL, LDLCALC, TRIG, CHOLHDL, LDLDIRECT in the last 72 hours. Thyroid Function Tests: No results for input(s): TSH, T4TOTAL, FREET4, T3FREE, THYROIDAB in the last 72 hours. Anemia Panel: No results for input(s): VITAMINB12, FOLATE, FERRITIN, TIBC, IRON, RETICCTPCT in the last 72 hours. Sepsis Labs: No results for input(s): PROCALCITON, LATICACIDVEN in the last 168 hours.  No results found for this or any previous visit (from the past 240 hour(s)).       Radiology Studies: Ct Soft Tissue Neck W Contrast  Result Date: 04/13/2017 CLINICAL DATA:  Right-sided facial swelling following root canal. EXAM: CT NECK WITH CONTRAST TECHNIQUE: Multidetector CT imaging of the neck was performed using the standard protocol following the bolus administration of intravenous contrast. CONTRAST:  27m ISOVUE-300 IOPAMIDOL (ISOVUE-300) INJECTION 61% COMPARISON:  None. FINDINGS: Pharynx and larynx: --Nasopharynx: Fossae of Rosenmuller are clear. Normal adenoid tonsils for age. --Oral cavity and oropharynx: There are findings of prior root canal at tooth 7 with a small periapical lucency. There is soft tissue swelling and inflammation overlying the site. No abscess or drainable fluid collection. There are scattered prominent level 1 lymph nodes measuring up to 1.1 cm on the right. --Hypopharynx: Normal vallecula and pyriform sinuses. --Larynx: Normal epiglottis and pre-epiglottic space. Normal aryepiglottic and vocal folds. --Retropharyngeal space: No abscess, effusion or lymphadenopathy. Salivary glands: --Parotid: No mass lesion or inflammation. No sialolithiasis or ductal dilatation. --Submandibular: Symmetric without inflammation. No sialolithiasis or ductal dilatation. --Sublingual: Normal. No ranula or other visible lesion of the base of tongue and floor of mouth. Thyroid: Normal. Lymph nodes:  Bilateral prominent level 1 lymph nodes, measuring up to 11 mm on the right. No lower cervical lymphadenopathy. Vascular: Major cervical vessels are patent. Limited intracranial: Normal. Visualized orbits: Normal. Mastoids and visualized paranasal sinuses: No fluid levels or advanced mucosal thickening. No mastoid effusion. Skeleton: No bony spinal canal stenosis. No lytic or blastic lesions. Upper chest: Clear. Other: None. IMPRESSION: 1. Small periapical lucency at the root of tooth 7 with overlying soft tissue swelling and inflammation consistent with odontogenic cellulitis. No abscess or drainable fluid collection. 2. Reactive level 1 lymphadenopathy. Electronically Signed   By: KUlyses JarredM.D.   On: 04/13/2017 05:24        Scheduled Meds: . enoxaparin (LOVENOX) injection  70 mg Subcutaneous Q24H  . escitalopram  20 mg Oral Daily  . gabapentin  300 mg Oral TID  . loratadine  10 mg Oral QHS  . montelukast  10 mg Oral Daily  . sodium chloride flush  3 mL Intravenous Q12H   Continuous Infusions: . sodium chloride    . sodium chloride 75 mL/hr at 04/14/17  1000  . ampicillin-sulbactam (UNASYN) IV 3 g (04/14/17 1210)     LOS: 0 days    Time spent: 35 minutes.     Elmarie Shiley, MD [facility] Pager [phone]  If 7PM-7AM, please contact night-coverage [email or web address] Password [email or web address] 04/14/2017, 1:31 PM

## 2017-04-15 LAB — CBC
HEMATOCRIT: 38.3 % (ref 36.0–46.0)
Hemoglobin: 12.4 g/dL (ref 12.0–15.0)
MCH: 27 pg (ref 26.0–34.0)
MCHC: 32.4 g/dL (ref 30.0–36.0)
MCV: 83.3 fL (ref 78.0–100.0)
PLATELETS: 312 10*3/uL (ref 150–400)
RBC: 4.6 MIL/uL (ref 3.87–5.11)
RDW: 13.7 % (ref 11.5–15.5)
WBC: 11.1 10*3/uL — ABNORMAL HIGH (ref 4.0–10.5)

## 2017-04-15 LAB — HIV ANTIBODY (ROUTINE TESTING W REFLEX): HIV SCREEN 4TH GENERATION: NONREACTIVE

## 2017-04-15 MED ORDER — FLUCONAZOLE 150 MG PO TABS
150.0000 mg | ORAL_TABLET | Freq: Once | ORAL | Status: DC
  Filled 2017-04-15: qty 1

## 2017-04-15 MED ORDER — AMOXICILLIN-POT CLAVULANATE 875-125 MG PO TABS: 1.0000 | ORAL_TABLET | Freq: Two times a day (BID) | ORAL | 0 refills | Status: DC

## 2017-04-15 MED ORDER — CHLORHEXIDINE GLUCONATE 0.12 % MT SOLN: 15.0000 mL | Freq: Two times a day (BID) | OROMUCOSAL | 0 refills | Status: DC

## 2017-04-15 MED ORDER — FLUCONAZOLE 150 MG PO TABS: 150.0000 mg | ORAL_TABLET | Freq: Once | ORAL | 0 refills | Status: AC

## 2017-04-15 MED ORDER — TRAMADOL HCL 50 MG PO TABS: 50.0000 mg | ORAL_TABLET | Freq: Four times a day (QID) | ORAL | 0 refills | Status: DC | PRN

## 2017-04-15 NOTE — Discharge Summary (Signed)
Physician Discharge Summary  Kellie Fisher:[id number] DOB: 1981-01-10 DOA: 04/13/2017  PCP: Haywood Pao, MD  Admit date: 04/13/2017 Discharge date: 04/15/2017  Admitted From: Home  Disposition: Home   Recommendations for Outpatient Follow-up:  1. Follow up with PCP in 1-2 weeks 2. Please obtain BMP/CBC in one week    Discharge Condition: Stable.  CODE STATUS: Full code.  Diet recommendation: Heart Healthy /  Brief/Interim Summary: Kellie Kundert Donaldsonis a 36 y.o.femalewith medical history significant fordepression, anxiety, and obesity, who presented to the emergency department for evaluation of facial swelling and pain. Patient reports that she underwent a root canal on 04/10/2017, and over the ensuing days has developed increasing swelling and pain at the right face. She was seen in the emergency department for this yesterday, appeared to be stable at that time, and was discharged home with Augmentin. She now returns with increased swelling and pain, but continues to deny fevers or chills. Pain and swelling is centered around the tooth that is status post recent root canal. Denies any purulent drainage, shortness of breath, or noisy breathing.   Assessment & Plan:   Principal Problem:   Cellulitis of oral soft tissues Active Problems:   Depression with anxiety  1-Facial cellulitis. Odontogenic.  Improving. Will continue with IV antibiotics. Treated with Unasyn. Patient will be discharge on Augmenting for 7 days..  Needs to follow up with her dentist  WBC trending down.   Oral care.   2-Asthma; nebulizer PRN.  No wheezing on lung exam.     Discharge Diagnoses:  Principal Problem:   Cellulitis of oral soft tissues Active Problems:   Depression with anxiety    Discharge Instructions  Discharge Instructions    Diet - low sodium heart healthy    Complete by:  As directed    Increase activity slowly    Complete by:  As directed      Allergies  as of 04/15/2017      Reactions   Azithromycin Other (See Comments)   Makes her crazy    Benzoyl Peroxide       Medication List    TAKE these medications   acetaminophen 500 MG tablet Commonly known as:  TYLENOL Take 1,000 mg by mouth every 6 (six) hours as needed. For pain.   amoxicillin-clavulanate 875-125 MG tablet Commonly known as:  AUGMENTIN Take 1 tablet by mouth every 12 (twelve) hours.   BYDUREON 2 MG Pen Generic drug:  Exenatide ER Inject 2 mg into the skin once a week.   chlorhexidine 0.12 % solution Commonly known as:  PERIDEX Use as directed 15 mLs in the mouth or throat 2 (two) times daily.   escitalopram 20 MG tablet Commonly known as:  LEXAPRO Take 20 mg by mouth daily.   etonogestrel-ethinyl estradiol 0.12-0.015 MG/24HR vaginal ring Commonly known as:  [place] 1 each vaginally every 28 (twenty-eight) days. Insert vaginally and leave in place for 3 consecutive weeks, then remove for 1 week.   fluconazole 150 MG tablet Commonly known as:  DIFLUCAN Take 1 tablet (150 mg total) by mouth once.   gabapentin 300 MG capsule Commonly known as:  NEURONTIN Take 300 mg by mouth 3 (three) times daily.   hydrOXYzine 10 MG tablet Commonly known as:  ATARAX/VISTARIL Take 10 mg by mouth 3 (three) times daily as needed for itching or anxiety.   loratadine 10 MG tablet Commonly known as:  CLARITIN Take 10 mg by mouth at bedtime.   montelukast 10 MG  tablet Commonly known as:  SINGULAIR Take 10 mg by mouth daily.   PROAIR HFA 108 (90 Base) MCG/ACT inhaler Generic drug:  albuterol Inhale 2 puffs into the lungs every 6 (six) hours as needed for wheezing or shortness of breath.   traMADol 50 MG tablet Commonly known as:  ULTRAM Take 1 tablet (50 mg total) by mouth every 6 (six) hours as needed for moderate pain.       Allergies  Allergen Reactions  . Azithromycin Other (See Comments)    Makes her crazy   . Benzoyl Peroxide      Consultations:  none   Procedures/Studies: Ct Soft Tissue Neck W Contrast  Result Date: 04/13/2017 CLINICAL DATA:  Right-sided facial swelling following root canal. EXAM: CT NECK WITH CONTRAST TECHNIQUE: Multidetector CT imaging of the neck was performed using the standard protocol following the bolus administration of intravenous contrast. CONTRAST:  68m ISOVUE-300 IOPAMIDOL (ISOVUE-300) INJECTION 61% COMPARISON:  None. FINDINGS: Pharynx and larynx: --Nasopharynx: Fossae of Rosenmuller are clear. Normal adenoid tonsils for age. --Oral cavity and oropharynx: There are findings of prior root canal at tooth 7 with a small periapical lucency. There is soft tissue swelling and inflammation overlying the site. No abscess or drainable fluid collection. There are scattered prominent level 1 lymph nodes measuring up to 1.1 cm on the right. --Hypopharynx: Normal vallecula and pyriform sinuses. --Larynx: Normal epiglottis and pre-epiglottic space. Normal aryepiglottic and vocal folds. --Retropharyngeal space: No abscess, effusion or lymphadenopathy. Salivary glands: --Parotid: No mass lesion or inflammation. No sialolithiasis or ductal dilatation. --Submandibular: Symmetric without inflammation. No sialolithiasis or ductal dilatation. --Sublingual: Normal. No ranula or other visible lesion of the base of tongue and floor of mouth. Thyroid: Normal. Lymph nodes: Bilateral prominent level 1 lymph nodes, measuring up to 11 mm on the right. No lower cervical lymphadenopathy. Vascular: Major cervical vessels are patent. Limited intracranial: Normal. Visualized orbits: Normal. Mastoids and visualized paranasal sinuses: No fluid levels or advanced mucosal thickening. No mastoid effusion. Skeleton: No bony spinal canal stenosis. No lytic or blastic lesions. Upper chest: Clear. Other: None. IMPRESSION: 1. Small periapical lucency at the root of tooth 7 with overlying soft tissue swelling and inflammation consistent with  odontogenic cellulitis. No abscess or drainable fluid collection. 2. Reactive level 1 lymphadenopathy. Electronically Signed   By: KUlyses JarredM.D.   On: 04/13/2017 05:24        Subjective: Feeling better. Redness of her face has improved.   Discharge Exam: Vitals:   04/14/17 2107 04/15/17 0532  BP: (!) 143/78 133/84  Pulse: (!) 102 89  Resp: 18 18  Temp: 98.2 F (36.8 C) 97.7 F (36.5 C)   Vitals:   04/14/17 0510 04/14/17 1324 04/14/17 2107 04/15/17 0532  BP: 127/89 137/80 (!) 143/78 133/84  Pulse: (!) 103 75 (!) 102 89  Resp: 18 18 18 18   Temp: 98.1 F (36.7 C) 98.3 F (36.8 C) 98.2 F (36.8 C) 97.7 F (36.5 C)  TempSrc: Oral Oral Oral Oral  SpO2: 95% 98% 98% 99%  Weight:      Height:        General: Pt is alert, awake, not in acute distress Face; with less edema and redness. Less periorbital edema Cardiovascular: RRR, S1/S2 +, no rubs, no gallops Respiratory: CTA bilaterally, no wheezing, no rhonchi Abdominal: Soft, NT, ND, bowel sounds + Extremities: no edema, no cyanosis    The results of significant diagnostics from this hospitalization (including imaging, microbiology, ancillary and laboratory) are listed  below for reference.     Microbiology: No results found for this or any previous visit (from the past 240 hour(s)).   Labs: BNP (last 3 results) No results for input(s): BNP in the last 8760 hours. Basic Metabolic Panel:  Recent Labs Lab 04/13/17 0315 04/14/17 0519  NA 136 138  K 4.3 3.9  CL 105 104  CO2 20* 25  GLUCOSE 153* 153*  BUN 7 9  CREATININE 0.70 0.66  CALCIUM 8.7* 8.5*   Liver Function Tests: No results for input(s): AST, ALT, ALKPHOS, BILITOT, PROT, ALBUMIN in the last 168 hours. No results for input(s): LIPASE, AMYLASE in the last 168 hours. No results for input(s): AMMONIA in the last 168 hours. CBC:  Recent Labs Lab 04/13/17 0315 04/14/17 0519 04/15/17 0453  WBC 14.4* 14.9* 11.1*  NEUTROABS 10.9* 10.7*  --   HGB  12.5 11.7* 12.4  HCT 38.2 36.1 38.3  MCV 82.9 83.4 83.3  PLT 297 319 312   Cardiac Enzymes: No results for input(s): CKTOTAL, CKMB, CKMBINDEX, TROPONINI in the last 168 hours. BNP: Invalid input(s): POCBNP CBG: No results for input(s): GLUCAP in the last 168 hours. D-Dimer No results for input(s): DDIMER in the last 72 hours. Hgb A1c No results for input(s): HGBA1C in the last 72 hours. Lipid Profile No results for input(s): CHOL, HDL, LDLCALC, TRIG, CHOLHDL, LDLDIRECT in the last 72 hours. Thyroid function studies No results for input(s): TSH, T4TOTAL, T3FREE, THYROIDAB in the last 72 hours.  Invalid input(s): FREET3 Anemia work up No results for input(s): VITAMINB12, FOLATE, FERRITIN, TIBC, IRON, RETICCTPCT in the last 72 hours. Urinalysis    Component Value Date/Time   COLORURINE YELLOW 09/20/2013 1855   APPEARANCEUR CLEAR 09/20/2013 1855   LABSPEC 1.021 09/20/2013 1855   PHURINE 6.0 09/20/2013 1855   GLUCOSEU NEGATIVE 09/20/2013 1855   HGBUR NEGATIVE 09/20/2013 1855   BILIRUBINUR NEGATIVE 09/20/2013 1855   KETONESUR NEGATIVE 09/20/2013 1855   PROTEINUR NEGATIVE 09/20/2013 1855   UROBILINOGEN 0.2 09/20/2013 1855   NITRITE NEGATIVE 09/20/2013 1855   LEUKOCYTESUR NEGATIVE 09/20/2013 1855   Sepsis Labs Invalid input(s): PROCALCITONIN,  WBC,  LACTICIDVEN Microbiology No results found for this or any previous visit (from the past 240 hour(s)).   Time coordinating discharge: Over 30 minutes  SIGNED:   Elmarie Shiley, MD  [facility] 04/15/2017, 8:55 AM Pager   If 7PM-7AM, please contact night-coverage [email or web address] Password [email or web address]

## 2017-04-15 NOTE — Progress Notes (Signed)
Reviewed AVS and discharge instructions w/pt and mother. Verbalized understanding and provided teachback. Questions answered to their satisfaction. Pt discharged home w/mother w/pain controlled and all belongings via ambulation.

## 2017-04-15 NOTE — Care Management Note (Signed)
Case Management Note  Patient Details  Name: Kellie Fisher MRN: [id number] Date of Birth: [birth year]  Subjective/Objective:      sepsis              Action/Plan: Date:  April 15, 2017 Chart reviewed for concurrent status and case management needs. Will continue to follow patient progress. Discharge Planning: following for needs Expected discharge date: [id number] Velva Harman, BSN, [place], [place]  Expected Discharge Date:                  Expected Discharge Plan:  Home/Self Care  In-House Referral:     Discharge planning Services  CM Consult  Post Acute Care Choice:    Choice offered to:     DME Arranged:    DME Agency:     HH Arranged:    HH Agency:     Status of Service:  In process, will continue to follow  If discussed at Long Length of Stay Meetings, dates discussed:    Additional Comments:  Leeroy Cha, RN 04/15/2017, 8:11 AM

## 2017-04-15 NOTE — Progress Notes (Signed)
Date: April 15, 2017 Chart reviewed for discharge orders: None found for case management. Vernia Buff, [phone]

## 2017-07-12 ENCOUNTER — Encounter (HOSPITAL_COMMUNITY): Payer: Self-pay | Admitting: Emergency Medicine

## 2017-07-12 ENCOUNTER — Ambulatory Visit (HOSPITAL_COMMUNITY)
Admission: EM | Admit: 2017-07-12 | Discharge: 2017-07-12 | Disposition: A | Discharge status: Home / Self Care | Payer: Medicaid Other | Attending: Family Medicine | Admitting: Family Medicine

## 2017-07-12 DIAGNOSIS — S99921A Unspecified injury of right foot, initial encounter: Secondary | ICD-10-CM | POA: Diagnosis not present

## 2017-07-12 DIAGNOSIS — W2203XA Walked into furniture, initial encounter: Secondary | ICD-10-CM

## 2017-07-12 DIAGNOSIS — M79674 Pain in right toe(s): Secondary | ICD-10-CM

## 2017-07-12 NOTE — ED Triage Notes (Signed)
Pt reports she banged her right 5th digit on her foot today against cough leg  Sx include swelling and bruising  Hx of DM  Steady gait... A&O x4... NAD... Ambulatory

## 2017-07-14 NOTE — ED Provider Notes (Signed)
  [place]   [id number] 07/12/17 Arrival Time: 7619  ASSESSMENT & PLAN:  1. Toe injury, right, initial encounter    Likely fracture. Cast shoe given. OTC analgesics. May f/u as needed. Reviewed expectations re: course of current medical issues. Questions answered. Outlined signs and symptoms indicating need for more acute intervention. Patient verbalized understanding. After Visit Summary given.   SUBJECTIVE:  Kellie Fisher is a 36 y.o. female who reports pain of her R 5th toe after hitting it on a piece of furniture today. Slight bruising. Ambulatory without difficulty. No sensation changes. No OTC treatment. No open wounds. Slight increase in discomfort when standing.  ROS: As per HPI.   OBJECTIVE:  Vitals:   07/12/17 1723  BP: 136/83  Pulse: 92  Resp: 18  Temp: 98.4 F (36.9 C)  TempSrc: Oral  SpO2: 97%    General appearance: alert; no distress Extremities: no cyanosis or edema; symmetrical with no gross deformities; tenderness over her R 5th toe with no swelling and mild bruising CV: normal extremity capillary refill Skin: warm and dry Neurologic: normal gait; normal symmetric reflexes in all extremities; normal sensation Psychological: alert and cooperative; normal mood and affect   Allergies  Allergen Reactions  . Azithromycin Other (See Comments)    Makes her crazy   . Benzoyl Peroxide     Past Medical History:  Diagnosis Date  . Ankle fracture   . Anxiety   . Depression   . Eczema   . Migraine   . PTSD (post-traumatic stress disorder)   . Tumor containing calcium    Social History   Social History  . Marital status: Married    Spouse name: N/A  . Number of children: N/A  . Years of education: N/A   Occupational History  . Not on file.   Social History Main Topics  . Smoking status: Never Smoker  . Smokeless tobacco: Never Used  . Alcohol use No     Comment: occasional  . Drug use: No  . Sexual activity: Yes   Birth control/ protection: IUD   Other Topics Concern  . Not on file   Social History Narrative  . No narrative on file   History reviewed. No pertinent family history. Past Surgical History:  Procedure Laterality Date  . CHOLECYSTECTOMY    . HERNIA REPAIR       Vanessa Kick, MD 07/14/17 [phone]

## 2017-10-17 ENCOUNTER — Encounter (HOSPITAL_COMMUNITY): Payer: Self-pay | Admitting: Emergency Medicine

## 2017-10-17 ENCOUNTER — Ambulatory Visit (HOSPITAL_COMMUNITY)
Admission: EM | Admit: 2017-10-17 | Discharge: 2017-10-17 | Disposition: A | Discharge status: Home / Self Care | Payer: Medicaid Other | Attending: Family Medicine | Admitting: Family Medicine

## 2017-10-17 ENCOUNTER — Other Ambulatory Visit: Payer: Self-pay

## 2017-10-17 DIAGNOSIS — B9789 Other viral agents as the cause of diseases classified elsewhere: Secondary | ICD-10-CM

## 2017-10-17 DIAGNOSIS — J069 Acute upper respiratory infection, unspecified: Secondary | ICD-10-CM | POA: Diagnosis not present

## 2017-10-17 DIAGNOSIS — J040 Acute laryngitis: Secondary | ICD-10-CM

## 2017-10-17 MED ORDER — PREDNISONE 20 MG PO TABS: 40.0000 mg | ORAL_TABLET | Freq: Every day | ORAL | 0 refills | Status: AC

## 2017-10-17 MED ORDER — ALBUTEROL SULFATE HFA 108 (90 BASE) MCG/ACT IN AERS: 1.0000 | INHALATION_SPRAY | Freq: Four times a day (QID) | RESPIRATORY_TRACT | 0 refills | Status: DC | PRN

## 2017-10-17 MED ORDER — BENZONATATE 100 MG PO CAPS: 100.0000 mg | ORAL_CAPSULE | Freq: Three times a day (TID) | ORAL | 0 refills | Status: DC

## 2017-10-17 NOTE — Discharge Instructions (Signed)
Push fluids to ensure adequate hydration and keep secretions thin.  Tylenol and/or ibuprofen as needed for pain or fevers.  Use of inhaler as needed for wheezing. Course of steroids likely can help with voice hoarseness and cough, take in the morning. Tessalon as needed for cough. If symptoms worsen, develop fevers, shortness of breath, chest pain, or do not improve in the next week to return to be seen or to follow up with your PCP.

## 2017-10-17 NOTE — ED Triage Notes (Signed)
Pt c/o cough, HA since Wednesday, states "I feel like I have bronchitis".

## 2017-10-17 NOTE — ED Provider Notes (Signed)
[facility]    CSN: [id number] Arrival date & time: 10/17/17  1001     History   Chief Complaint Chief Complaint  Patient presents with  . Cough    HPI Kellie Fisher is a 37 y.o. female.   Charina presents with complaints of cough which is causing her neck and head to ache as well as hoarse voice. Mild sore throat. Symptoms started two days ago. Without fevers. Mild ear pain. Denies gi/gu complaints. No known ill contacts. Does not smoke, states has had intermittent asthma, does not have an inhaler at home. Without shortness of breath or chest pain. History of anxiety, depression and migraines. Cough is non productive. Without runny nose.     ROS per HPI.       Past Medical History:  Diagnosis Date  . Ankle fracture   . Anxiety   . Depression   . Eczema   . Migraine   . PTSD (post-traumatic stress disorder)   . Tumor containing calcium     Patient Active Problem List   Diagnosis Date Noted  . Cellulitis of oral soft tissues 04/13/2017  . Depression with anxiety 04/13/2017    Past Surgical History:  Procedure Laterality Date  . CHOLECYSTECTOMY    . HERNIA REPAIR      OB History    No data available       Home Medications    Prior to Admission medications   Medication Sig Start Date End Date Taking? Authorizing Provider  acetaminophen (TYLENOL) 500 MG tablet Take 1,000 mg by mouth every 6 (six) hours as needed. For pain.    [provider]  albuterol (PROVENTIL HFA;VENTOLIN HFA) 108 (90 Base) MCG/ACT inhaler Inhale 1-2 puffs into the lungs every 6 (six) hours as needed for wheezing or shortness of breath. 10/17/17   Zigmund Gottron, NP  benzonatate (TESSALON) 100 MG capsule Take 1 capsule (100 mg total) by mouth every 8 (eight) hours. 10/17/17   Sondra Blixt, Malachy Moan, NP  BYDUREON 2 MG PEN Inject 2 mg into the skin once a week. 02/28/17   [provider]  chlorhexidine (PERIDEX) 0.12 % solution Use as directed 15 mLs in the mouth  or throat 2 (two) times daily. 04/15/17   Regalado, Belkys A, MD  escitalopram (LEXAPRO) 20 MG tablet Take 20 mg by mouth daily. 03/30/17   [provider]  etonogestrel-ethinyl estradiol (NUVARING) 0.12-0.015 MG/24HR vaginal ring Place 1 each vaginally every 28 (twenty-eight) days. Insert vaginally and leave in place for 3 consecutive weeks, then remove for 1 week.    [provider]  gabapentin (NEURONTIN) 300 MG capsule Take 300 mg by mouth 3 (three) times daily. 03/11/17   [provider]  hydrOXYzine (ATARAX/VISTARIL) 10 MG tablet Take 10 mg by mouth 3 (three) times daily as needed for itching or anxiety.     [provider]  loratadine (CLARITIN) 10 MG tablet Take 10 mg by mouth at bedtime.    [provider]  montelukast (SINGULAIR) 10 MG tablet Take 10 mg by mouth daily. 03/30/17   [provider]  predniSONE (DELTASONE) 20 MG tablet Take 2 tablets (40 mg total) by mouth daily with breakfast for 5 days. 10/17/17 10/22/17  Zigmund Gottron, NP  traMADol (ULTRAM) 50 MG tablet Take 1 tablet (50 mg total) by mouth every 6 (six) hours as needed for moderate pain. 04/15/17   Regalado, Cassie Freer, MD    Family History No family history on file.  Social History Social History   Tobacco Use  . Smoking status: Never Smoker  . Smokeless tobacco: Never Used  Substance Use Topics  . Alcohol use: No    Comment: occasional  . Drug use: No     Allergies   Azithromycin and Benzoyl peroxide   Review of Systems Review of Systems   Physical Exam Triage Vital Signs ED Triage Vitals [10/17/17 1012]  Enc Vitals Group     BP 137/76     Pulse Rate 95     Resp 18     Temp (!) 97.3 F (36.3 C)     Temp src      SpO2 98 %     Weight      Height      Head Circumference      Peak Flow      Pain Score      Pain Loc      Pain Edu?      Excl. in [place]?    No data found.  Updated Vital Signs BP 137/76   Pulse 95   Temp (!) 97.3 F (36.3 C)    Resp 18   SpO2 98%   Visual Acuity Right Eye Distance:   Left Eye Distance:   Bilateral Distance:    Right Eye Near:   Left Eye Near:    Bilateral Near:     Physical Exam  Constitutional: She is oriented to person, place, and time. She appears well-developed and well-nourished. No distress.  HENT:  Head: Normocephalic and atraumatic.  Right Ear: Tympanic membrane, external ear and ear canal normal.  Left Ear: Tympanic membrane, external ear and ear canal normal.  Nose: Nose normal.  Mouth/Throat: Uvula is midline, oropharynx is clear and moist and mucous membranes are normal. No tonsillar exudate.  Eyes: Conjunctivae and EOM are normal. Pupils are equal, round, and reactive to light.  Cardiovascular: Normal rate, regular rhythm and normal heart sounds.  Pulmonary/Chest: Effort normal and breath sounds normal.  Infrequent strong dry cough noted; hoarse voice noted  Neurological: She is alert and oriented to person, place, and time.  Skin: Skin is warm and dry.     UC Treatments / Results  Labs (all labs ordered are listed, but only abnormal results are displayed) Labs Reviewed - No data to display  EKG  EKG Interpretation None       Radiology No results found.  Procedures Procedures (including critical care time)  Medications Ordered in UC Medications - No data to display   Initial Impression / Assessment and Plan / UC Course  I have reviewed the triage vital signs and the nursing notes.  Pertinent labs & imaging results that were available during my care of the patient were reviewed by me and considered in my medical decision making (see chart for details).     Benign physical findings. Non toxic in appearance. Without tachypnea, tachycardia, hypoxia. Afebrile. History and physical consistent with viral illness.  Supportive cares recommended. Return precautions provided. Patient verbalized understanding and agreeable to plan.     Final Clinical  Impressions(s) / UC Diagnoses   Final diagnoses:  Viral URI with cough  Laryngitis    ED Discharge Orders        Ordered    albuterol (PROVENTIL HFA;VENTOLIN HFA) 108 (90 Base) MCG/ACT inhaler  Every 6 hours PRN     10/17/17 1023    predniSONE (DELTASONE) 20 MG tablet  Daily with breakfast     10/17/17 1023  benzonatate (TESSALON) 100 MG capsule  Every 8 hours     10/17/17 1023       Controlled Substance Prescriptions [place] Controlled Substance Registry consulted? Not Applicable   Zigmund Gottron, NP 10/17/17 1028

## 2017-10-19 ENCOUNTER — Other Ambulatory Visit: Payer: Self-pay

## 2017-10-19 ENCOUNTER — Ambulatory Visit (INDEPENDENT_AMBULATORY_CARE_PROVIDER_SITE_OTHER): Payer: Medicaid Other

## 2017-10-19 ENCOUNTER — Ambulatory Visit (HOSPITAL_COMMUNITY)
Admission: EM | Admit: 2017-10-19 | Discharge: 2017-10-19 | Disposition: A | Discharge status: Home / Self Care | Payer: Medicaid Other | Attending: Family Medicine | Admitting: Family Medicine

## 2017-10-19 ENCOUNTER — Encounter (HOSPITAL_COMMUNITY): Payer: Self-pay | Admitting: Family Medicine

## 2017-10-19 DIAGNOSIS — R0602 Shortness of breath: Secondary | ICD-10-CM | POA: Diagnosis not present

## 2017-10-19 DIAGNOSIS — R05 Cough: Secondary | ICD-10-CM

## 2017-10-19 DIAGNOSIS — J069 Acute upper respiratory infection, unspecified: Secondary | ICD-10-CM | POA: Diagnosis not present

## 2017-10-19 DIAGNOSIS — B9789 Other viral agents as the cause of diseases classified elsewhere: Secondary | ICD-10-CM

## 2017-10-19 MED ORDER — SALINE SPRAY 0.65 % NA SOLN: 1.0000 | NASAL | 0 refills | Status: DC | PRN

## 2017-10-19 MED ORDER — IPRATROPIUM BROMIDE 0.03 % NA SOLN: 2.0000 | Freq: Two times a day (BID) | NASAL | 12 refills | Status: DC

## 2017-10-19 MED ORDER — DM-GUAIFENESIN ER 30-600 MG PO TB12: 1.0000 | ORAL_TABLET | Freq: Two times a day (BID) | ORAL | 0 refills | Status: DC

## 2017-10-19 MED ORDER — AMOXICILLIN-POT CLAVULANATE 875-125 MG PO TABS: 1.0000 | ORAL_TABLET | Freq: Two times a day (BID) | ORAL | 0 refills | Status: AC

## 2017-10-19 NOTE — Discharge Instructions (Signed)
Your xray is normal today, without pneumonia, bronchitis or pleural effusion present, which is reassuring.  Continue with the prednisone daily to complete the 5 day course.  Tessalon and albuterol as needed for cough. May add in twice a day mucinex dm to help with cough and congestion, daily nasal spray, and as needed nasal saline to help with congestion. I have sent antibiotics to be started on Wednesday if your symptoms persist or worsen. Please continue to follow with your primary care provider if your symptoms do not improve or if worsen despite treatments listed above.  Tylenol and/or ibuprofen as needed for pain or fevers.

## 2017-10-19 NOTE — ED Provider Notes (Signed)
[place]    CSN: [id number] Arrival date & time: 10/19/17  1200     History   Chief Complaint Chief Complaint  Patient presents with  . Cough    HPI Kellie Fisher is a 37 y.o. female.   Kellie Fisher presents with complaints of worsening cough with mucus production, green nasal discharge. Symptoms started on 1/23. She feels she is getting worse. No known fevers. Does not smoke. Without gi/gu complaints. Mild sore throat and ear pain. Chest pain related to cough. Took aleve this morning which mildly helped. Has been taking tessalon and using albuterol inhaler as needed. Albuterol has been helping, last use this morning. Was seen 1/25 and started course of prednisone, has been taking. No known fevers. Per family member in room patient with history of pneumonia and sinus infections.     ROS per HPI.       Past Medical History:  Diagnosis Date  . Ankle fracture   . Anxiety   . Depression   . Eczema   . Migraine   . PTSD (post-traumatic stress disorder)   . Tumor containing calcium     Patient Active Problem List   Diagnosis Date Noted  . Cellulitis of oral soft tissues 04/13/2017  . Depression with anxiety 04/13/2017    Past Surgical History:  Procedure Laterality Date  . CHOLECYSTECTOMY    . HERNIA REPAIR      OB History    No data available       Home Medications    Prior to Admission medications   Medication Sig Start Date End Date Taking? Authorizing Provider  acetaminophen (TYLENOL) 500 MG tablet Take 1,000 mg by mouth every 6 (six) hours as needed. For pain.   Yes [provider]  albuterol (PROVENTIL HFA;VENTOLIN HFA) 108 (90 Base) MCG/ACT inhaler Inhale 1-2 puffs into the lungs every 6 (six) hours as needed for wheezing or shortness of breath. 10/17/17  Yes Sylus Stgermain B, NP  benzonatate (TESSALON) 100 MG capsule Take 1 capsule (100 mg total) by mouth every 8 (eight) hours. 10/17/17  Yes Miesha Bachmann B, NP  BYDUREON 2 MG PEN  Inject 2 mg into the skin once a week. 02/28/17  Yes [provider]  escitalopram (LEXAPRO) 20 MG tablet Take 20 mg by mouth daily. 03/30/17  Yes [provider]  etonogestrel-ethinyl estradiol (NUVARING) 0.12-0.015 MG/24HR vaginal ring Place 1 each vaginally every 28 (twenty-eight) days. Insert vaginally and leave in place for 3 consecutive weeks, then remove for 1 week.   Yes [provider]  gabapentin (NEURONTIN) 300 MG capsule Take 300 mg by mouth 3 (three) times daily. 03/11/17  Yes [provider]  hydrOXYzine (ATARAX/VISTARIL) 10 MG tablet Take 10 mg by mouth 3 (three) times daily as needed for itching or anxiety.    Yes [provider]  loratadine (CLARITIN) 10 MG tablet Take 10 mg by mouth at bedtime.   Yes [provider]  montelukast (SINGULAIR) 10 MG tablet Take 10 mg by mouth daily. 03/30/17  Yes [provider]  predniSONE (DELTASONE) 20 MG tablet Take 2 tablets (40 mg total) by mouth daily with breakfast for 5 days. 10/17/17 10/22/17 Yes Augusto Gamble B, NP  amoxicillin-clavulanate (AUGMENTIN) 875-125 MG tablet Take 1 tablet by mouth every 12 (twelve) hours for 10 days. 10/22/17 11/01/17  Zigmund Gottron, NP  chlorhexidine (PERIDEX) 0.12 % solution Use as directed 15 mLs in the mouth or throat 2 (two) times daily. 04/15/17  Regalado, Belkys A, MD  dextromethorphan-guaiFENesin (MUCINEX DM) 30-600 MG 12hr tablet Take 1 tablet by mouth 2 (two) times daily. 10/19/17   Augusto Gamble B, NP  ipratropium (ATROVENT) 0.03 % nasal spray Place 2 sprays into both nostrils every 12 (twelve) hours. 10/19/17   Zigmund Gottron, NP  sodium chloride (OCEAN) 0.65 % SOLN nasal spray Place 1 spray into both nostrils as needed for congestion. 10/19/17   Zigmund Gottron, NP    Family History No family history on file.  Social History Social History   Tobacco Use  . Smoking status: Never Smoker  . Smokeless tobacco: Never Used  Substance Use Topics   . Alcohol use: No    Comment: occasional  . Drug use: No     Allergies   Azithromycin and Benzoyl peroxide   Review of Systems Review of Systems   Physical Exam Triage Vital Signs ED Triage Vitals  Enc Vitals Group     BP 10/19/17 1208 117/82     Pulse Rate 10/19/17 1208 (!) 106     Resp 10/19/17 1208 20     Temp 10/19/17 1208 97.7 F (36.5 C)     Temp Source 10/19/17 1208 Oral     SpO2 10/19/17 1208 97 %     Weight --      Height --      Head Circumference --      Peak Flow --      Pain Score 10/19/17 1210 3     Pain Loc --      Pain Edu? --      Excl. in [place]? --    No data found.  Updated Vital Signs BP 117/82   Pulse (!) 104   Temp 97.7 F (36.5 C) (Oral)   Resp 20   SpO2 97%   Visual Acuity Right Eye Distance:   Left Eye Distance:   Bilateral Distance:    Right Eye Near:   Left Eye Near:    Bilateral Near:     Physical Exam  Constitutional: She is oriented to person, place, and time. She appears well-developed and well-nourished. No distress.  HENT:  Head: Normocephalic and atraumatic.  Right Ear: Tympanic membrane, external ear and ear canal normal.  Left Ear: Tympanic membrane, external ear and ear canal normal.  Nose: Mucosal edema and rhinorrhea present.  Mouth/Throat: Uvula is midline, oropharynx is clear and moist and mucous membranes are normal. No tonsillar exudate.  Eyes: Conjunctivae and EOM are normal. Pupils are equal, round, and reactive to light.  Cardiovascular: Normal rate, regular rhythm and normal heart sounds.  Pulmonary/Chest: Effort normal and breath sounds normal.  Neurological: She is alert and oriented to person, place, and time.  Skin: Skin is warm and dry.     UC Treatments / Results  Labs (all labs ordered are listed, but only abnormal results are displayed) Labs Reviewed - No data to display  EKG  EKG Interpretation None       Radiology Dg Chest 2 View  Result Date: 10/19/2017 CLINICAL DATA:  Cough  shortness of breath and fatigue. EXAM: CHEST  2 VIEW COMPARISON:  02/13/2014 FINDINGS: The lungs are clear without focal pneumonia, edema, pneumothorax or pleural effusion. The cardiopericardial silhouette is within normal limits for size. The visualized bony structures of the thorax are intact. IMPRESSION: No active cardiopulmonary disease. Electronically Signed   By: Misty Stanley M.D.   On: 10/19/2017 12:50    Procedures Procedures (including critical care time)  Medications Ordered in UC Medications - No data to display   Initial Impression / Assessment and Plan / UC Course  I have reviewed the triage vital signs and the nursing notes.  Pertinent labs & imaging results that were available during my care of the patient were reviewed by me and considered in my medical decision making (see chart for details).     Chest xray negative. Patient primarily with URI symptoms at this time, appears uncomfortable with nasal drainage. 4 days of symptoms, afebrile. augmentin to be started in 3 days if symptoms persist or worsen. Continue with supportive cares. Push fluids. If symptoms worsen or do not improve in the next week to return to be seen or to follow up with PCP.  Patient verbalized understanding and agreeable to plan.    Final Clinical Impressions(s) / UC Diagnoses   Final diagnoses:  Viral URI with cough    ED Discharge Orders        Ordered    amoxicillin-clavulanate (AUGMENTIN) 875-125 MG tablet  Every 12 hours     10/19/17 1255    ipratropium (ATROVENT) 0.03 % nasal spray  Every 12 hours     10/19/17 1255    sodium chloride (OCEAN) 0.65 % SOLN nasal spray  As needed     10/19/17 1255    dextromethorphan-guaiFENesin (MUCINEX DM) 30-600 MG 12hr tablet  2 times daily     10/19/17 1255       Controlled Substance Prescriptions [place] Controlled Substance Registry consulted? Not Applicable   Zigmund Gottron, NP 10/19/17 1305

## 2017-10-19 NOTE — ED Triage Notes (Signed)
Was seen in [facility] 2 days ago; has been taking albuterol, Tessalon, and prednisone.  C/OP some SOB last night. States feels she is getting worse.  Unsure if fevers.

## 2018-04-30 ENCOUNTER — Other Ambulatory Visit: Payer: Self-pay

## 2018-04-30 ENCOUNTER — Encounter (HOSPITAL_COMMUNITY): Payer: Self-pay | Admitting: Emergency Medicine

## 2018-04-30 ENCOUNTER — Emergency Department (HOSPITAL_COMMUNITY)
Admission: EM | Admit: 2018-04-30 | Discharge: 2018-05-01 | Disposition: A | Discharge status: Home / Self Care | Payer: Medicaid Other | Attending: Emergency Medicine | Admitting: Emergency Medicine

## 2018-04-30 DIAGNOSIS — R197 Diarrhea, unspecified: Secondary | ICD-10-CM | POA: Insufficient documentation

## 2018-04-30 DIAGNOSIS — R42 Dizziness and giddiness: Secondary | ICD-10-CM | POA: Diagnosis not present

## 2018-04-30 DIAGNOSIS — R51 Headache: Secondary | ICD-10-CM | POA: Insufficient documentation

## 2018-04-30 DIAGNOSIS — M7918 Myalgia, other site: Secondary | ICD-10-CM | POA: Diagnosis not present

## 2018-04-30 DIAGNOSIS — R519 Headache, unspecified: Secondary | ICD-10-CM

## 2018-04-30 HISTORY — DX: Type 2 diabetes mellitus without complications: E11.9

## 2018-04-30 LAB — URINALYSIS, ROUTINE W REFLEX MICROSCOPIC
BILIRUBIN URINE: NEGATIVE
GLUCOSE, UA: NEGATIVE mg/dL
HGB URINE DIPSTICK: NEGATIVE
Ketones, ur: NEGATIVE mg/dL
Leukocytes, UA: NEGATIVE
Nitrite: NEGATIVE
PH: 5 (ref 5.0–8.0)
Protein, ur: NEGATIVE mg/dL
SPECIFIC GRAVITY, URINE: 1.024 (ref 1.005–1.030)

## 2018-04-30 LAB — COMPREHENSIVE METABOLIC PANEL
ALK PHOS: 65 U/L (ref 38–126)
ALT: 35 U/L (ref 0–44)
ANION GAP: 10 (ref 5–15)
AST: 39 U/L (ref 15–41)
Albumin: 3.3 g/dL — ABNORMAL LOW (ref 3.5–5.0)
BUN: 9 mg/dL (ref 6–20)
CALCIUM: 8.7 mg/dL — AB (ref 8.9–10.3)
CO2: 23 mmol/L (ref 22–32)
Chloride: 109 mmol/L (ref 98–111)
Creatinine, Ser: 0.62 mg/dL (ref 0.44–1.00)
GFR calc Af Amer: >60 mL/min (ref >60)
GFR calc non Af Amer: >60 mL/min (ref >60)
GLUCOSE: 152 mg/dL — AB (ref 70–99)
Potassium: 3.8 mmol/L (ref 3.5–5.1)
Sodium: 142 mmol/L (ref 135–145)
Total Bilirubin: 0.2 mg/dL — ABNORMAL LOW (ref 0.3–1.2)
Total Protein: 6.9 g/dL (ref 6.5–8.1)

## 2018-04-30 LAB — CBC
HEMATOCRIT: 42.8 % (ref 36.0–46.0)
HEMOGLOBIN: 14.1 g/dL (ref 12.0–15.0)
MCH: 27.8 pg (ref 26.0–34.0)
MCHC: 32.9 g/dL (ref 30.0–36.0)
MCV: 84.3 fL (ref 78.0–100.0)
Platelets: 326 10*3/uL (ref 150–400)
RBC: 5.08 MIL/uL (ref 3.87–5.11)
RDW: 13.2 % (ref 11.5–15.5)
WBC: 10.6 10*3/uL — ABNORMAL HIGH (ref 4.0–10.5)

## 2018-04-30 LAB — I-STAT BETA HCG BLOOD, ED (MC, WL, AP ONLY): I-stat hCG, quantitative: <5 m[IU]/mL (ref <5)

## 2018-04-30 LAB — LIPASE, BLOOD: Lipase: 34 U/L (ref 11–51)

## 2018-04-30 NOTE — ED Notes (Signed)
Pt is aware a urine sample is needed, specimen cup provided in lobby.

## 2018-04-30 NOTE — ED Triage Notes (Addendum)
Pt from home with c/o headache. Pt has a history of migraines. Pt states she also is feeling a throbbing pain in her head and pain from her head to her toes. Pt states she is unsure if this is a typical migraine. Pt is also having sensitivity to light and nausea. Pt also reports recurrent diarrhea recently with 5 episodes a day

## 2018-05-01 MED ORDER — KETOROLAC TROMETHAMINE 15 MG/ML IJ SOLN
15.0000 mg | Freq: Once | INTRAMUSCULAR | Status: AC
  Administered 2018-05-01: 15 mg via INTRAVENOUS
  Filled 2018-05-01: qty 1

## 2018-05-01 MED ORDER — METOCLOPRAMIDE HCL 5 MG/ML IJ SOLN
10.0000 mg | Freq: Once | INTRAMUSCULAR | Status: AC
  Administered 2018-05-01: 10 mg via INTRAVENOUS
  Filled 2018-05-01: qty 2

## 2018-05-01 MED ORDER — DIPHENHYDRAMINE HCL 50 MG/ML IJ SOLN
25.0000 mg | Freq: Once | INTRAMUSCULAR | Status: AC
  Administered 2018-05-01: 25 mg via INTRAVENOUS
  Filled 2018-05-01: qty 1

## 2018-05-01 MED ORDER — MECLIZINE HCL 25 MG PO TABS
25.0000 mg | ORAL_TABLET | Freq: Once | ORAL | Status: AC
  Administered 2018-05-01: 25 mg via ORAL
  Filled 2018-05-01: qty 1

## 2018-05-01 MED ORDER — SODIUM CHLORIDE 0.9 % IV BOLUS
1000.0000 mL | Freq: Once | INTRAVENOUS | Status: AC
  Administered 2018-05-01: 1000 mL via INTRAVENOUS

## 2018-05-01 NOTE — ED Notes (Signed)
Family at bedside. 

## 2018-05-01 NOTE — Discharge Instructions (Signed)
Please follow-up with your pharmacy to see when your last shot was.  Please follow-up with your primary care doctor/neurologist to discuss migraine control and treatment.  If your symptoms worsen, you develop fevers or have any concerns please seek additional medical care and evaluation.  Please make sure that you are staying well-hydrated.

## 2018-05-01 NOTE — ED Notes (Signed)
Pt reports she woke up with migraine h/a yesterday am.  Positive history.  Pt is A&OX 4.  Reports nausea but denies vomiting.  No obvious neuro deficits.

## 2018-05-01 NOTE — ED Notes (Signed)
She feels much better and is ready to go home.

## 2018-05-01 NOTE — ED Provider Notes (Signed)
[place] DEPT Provider Note   CSN: [id number] Arrival date & time: 04/30/18  1957     History   Chief Complaint Chief Complaint  Patient presents with  . Headache  . Generalized Body Aches  . Diarrhea    HPI Kellie Fisher is a 37 y.o. female with a past medical history of migraines who presents today for evaluation.  She states that she takes a monthly shot for migraine prevention (Emgality), and thinks that she missed her shot last month.  She reports that the last time her head hurt this bad was about a year ago.  She denies any fevers at home.  She does report diarrhea, stating that she had 5 episodes today.  She says that normally her migraines are around 1 eye, however this is her entire head hurting.  She denies any neck pain.  No numbness or shortness of breath.   No visual changes.  This does not feel like her normal headaches but she missed the Emgality shot so not sure how her headaches should feel. She also reports feeling like things are spinning.    She denies recent trauma.     HPI  Past Medical History:  Diagnosis Date  . Ankle fracture   . Anxiety   . Depression   . Diabetes mellitus without complication ([place])   . Eczema   . Migraine   . PTSD (post-traumatic stress disorder)   . Tumor containing calcium     Patient Active Problem List   Diagnosis Date Noted  . Cellulitis of oral soft tissues 04/13/2017  . Depression with anxiety 04/13/2017    Past Surgical History:  Procedure Laterality Date  . CHOLECYSTECTOMY    . HERNIA REPAIR       OB History   None      Home Medications    Prior to Admission medications   Medication Sig Start Date End Date Taking? Authorizing Provider  acetaminophen (TYLENOL) 500 MG tablet Take 1,000 mg by mouth every 6 (six) hours as needed. For pain.   Yes [provider]  albuterol (PROVENTIL HFA;VENTOLIN HFA) 108 (90 Base) MCG/ACT inhaler Inhale 1-2 puffs into the lungs  every 6 (six) hours as needed for wheezing or shortness of breath. 10/17/17  Yes Burky, Natalie B, NP  BYDUREON 2 MG PEN Inject 2 mg into the skin once a week. 02/28/17  Yes [provider]  dextromethorphan-guaiFENesin (MUCINEX DM) 30-600 MG 12hr tablet Take 1 tablet by mouth 2 (two) times daily. 10/19/17  Yes Burky, Natalie B, NP  EMGALITY 120 MG/ML SOAJ Inject 120 mg into the skin every 30 (thirty) days. 04/01/18  Yes [provider]  escitalopram (LEXAPRO) 20 MG tablet Take 20 mg by mouth daily. 03/30/17  Yes [provider]  etonogestrel-ethinyl estradiol (NUVARING) 0.12-0.015 MG/24HR vaginal ring Place 1 each vaginally every 28 (twenty-eight) days. Insert vaginally and leave in place for 3 consecutive weeks, then remove for 1 week.   Yes [provider]  gabapentin (NEURONTIN) 300 MG capsule Take 300 mg by mouth 3 (three) times daily. 03/11/17  Yes [provider]  hydrOXYzine (ATARAX/VISTARIL) 10 MG tablet Take 10 mg by mouth 3 (three) times daily as needed for itching or anxiety.    Yes [provider]  ipratropium (ATROVENT) 0.03 % nasal spray Place 2 sprays into both nostrils every 12 (twelve) hours. 10/19/17  Yes Augusto Gamble B, NP  lamoTRIgine (LAMICTAL) 150 MG tablet Take 150 mg by mouth  at bedtime. 04/16/18  Yes [provider]  loratadine (CLARITIN) 10 MG tablet Take 10 mg by mouth at bedtime.   Yes [provider]  montelukast (SINGULAIR) 10 MG tablet Take 10 mg by mouth daily. 03/30/17  Yes [provider]  traZODone (DESYREL) 50 MG tablet Take 50 mg by mouth at bedtime. 04/16/18  Yes [provider]  benzonatate (TESSALON) 100 MG capsule Take 1 capsule (100 mg total) by mouth every 8 (eight) hours. Patient not taking: Reported on 04/30/2018 10/17/17   Augusto Gamble B, NP  chlorhexidine (PERIDEX) 0.12 % solution Use as directed 15 mLs in the mouth or throat 2 (two) times daily. Patient not taking: Reported  on 04/30/2018 04/15/17   Regalado, Jerald Kief A, MD  sodium chloride (OCEAN) 0.65 % SOLN nasal spray Place 1 spray into both nostrils as needed for congestion. Patient not taking: Reported on 04/30/2018 10/19/17   Zigmund Gottron, NP    Family History No family history on file.  Social History Social History   Tobacco Use  . Smoking status: Never Smoker  . Smokeless tobacco: Never Used  Substance Use Topics  . Alcohol use: No    Comment: occasional  . Drug use: No     Allergies   Azithromycin and Benzoyl peroxide   Review of Systems Review of Systems  Constitutional: Negative for chills and fever.  HENT: Negative for ear pain and sore throat.   Eyes: Negative for pain and visual disturbance.  Respiratory: Negative for cough and shortness of breath.   Cardiovascular: Negative for chest pain and palpitations.  Gastrointestinal: Negative for abdominal pain and vomiting.  Genitourinary: Negative for dysuria and hematuria.  Musculoskeletal: Negative for arthralgias and back pain.  Skin: Negative for color change and rash.  Neurological: Positive for dizziness and headaches. Negative for seizures and syncope.  All other systems reviewed and are negative.    Physical Exam Updated Vital Signs BP 126/82 (BP Location: Left Arm)   Pulse 83   Temp 98 F (36.7 C) (Oral)   Resp 16   Ht 5' 3.5" (1.613 m)   Wt (!) 136.5 kg   SpO2 100%   BMI 52.47 kg/m   Physical Exam  Constitutional: She appears well-developed and well-nourished. No distress.  HENT:  Head: Normocephalic and atraumatic.  Eyes: Conjunctivae are normal. Right eye exhibits no discharge. Left eye exhibits no discharge. No scleral icterus.  Neck: Normal range of motion.  Cardiovascular: Normal rate and regular rhythm.  Pulmonary/Chest: Effort normal. No stridor. No respiratory distress.  Abdominal: She exhibits no distension.  Musculoskeletal: She exhibits no edema or deformity.  Neurological: She is alert. She  exhibits normal muscle tone.  Mental Status:  Alert, oriented, thought content appropriate, able to give a coherent history. Speech fluent without evidence of aphasia. Able to follow 2 step commands without difficulty.  Cranial Nerves:  II:  Peripheral visual fields grossly normal, pupils equal, round, reactive to light III,IV, VI: ptosis not present, extra-ocular motions intact bilaterally  V,VII: smile symmetric, facial light touch sensation equal VIII: hearing grossly normal to voice  X: uvula elevates symmetrically  XI: bilateral shoulder shrug symmetric and strong XII: midline tongue extension without fassiculations Motor:  Normal tone. 5/5 in upper and lower extremities bilaterally including strong and equal grip strength and dorsiflexion/plantar flexion Cerebellar: normal finger-to-nose with bilateral upper extremities CV: distal pulses palpable throughout    Skin: Skin is warm and dry. She is not diaphoretic.  Psychiatric: She has a  normal mood and affect. Her behavior is normal.  Nursing note and vitals reviewed.    ED Treatments / Results  Labs (all labs ordered are listed, but only abnormal results are displayed) Labs Reviewed  COMPREHENSIVE METABOLIC PANEL - Abnormal; Notable for the following components:      Result Value   Glucose, Bld 152 (*)    Calcium 8.7 (*)    Albumin 3.3 (*)    Total Bilirubin 0.2 (*)    All other components within normal limits  CBC - Abnormal; Notable for the following components:   WBC 10.6 (*)    All other components within normal limits  LIPASE, BLOOD  URINALYSIS, ROUTINE W REFLEX MICROSCOPIC  I-STAT BETA HCG BLOOD, ED ([facility], [facility], [facility] ONLY)    EKG None  Radiology No results found.  Procedures Procedures (including critical care time)  Medications Ordered in ED Medications  ketorolac (TORADOL) 15 MG/ML injection 15 mg (15 mg Intravenous Given 05/01/18 0137)  metoCLOPramide (REGLAN) injection 10 mg (10 mg Intravenous Given 05/01/18  0137)  sodium chloride 0.9 % bolus 1,000 mL (0 mLs Intravenous Stopped 05/01/18 0242)  meclizine (ANTIVERT) tablet 25 mg (25 mg Oral Given 05/01/18 0138)  diphenhydrAMINE (BENADRYL) injection 25 mg (25 mg Intravenous Given 05/01/18 0138)     Initial Impression / Assessment and Plan / ED Course  I have reviewed the triage vital signs and the nursing notes.  Pertinent labs & imaging results that were available during my care of the patient were reviewed by me and considered in my medical decision making (see chart for details).  Clinical Course as of May 02 619  Fri May 01, 2018  0255 Was informed that patient was requesting discharge.  Discussed with patient who states her headache and dizziness are both gone and wishes to go home at this time.  Return precautions discussed   [EH]    Clinical Course User Index [EH] Lorin Glass, PA-C    Patient presents today for evaluation of headache.  She has a long-standing history of migraines, including taking monthly shots to help prevent them and states she missed her most recent shot.  No fevers or chills.  No constitutional symptoms.  She does report nausea without vomiting in addition to light sensitivity.  She is neurologically intact on my exam.  She has no nuchal rigidity or neck pain.    She was treated with migraine cocktail, minus the steroids due to her diabetes, and had significant improvement and requested discharge.  Return precautions were discussed with patient who states their understanding.  At the time of discharge patient denied any unaddressed complaints or concerns.  Patient is agreeable for discharge home.   Final Clinical Impressions(s) / ED Diagnoses   Final diagnoses:  Acute nonintractable headache, unspecified headache type    ED Discharge Orders    None       Ollen Gross 05/01/18 3500    Ezequiel Essex, MD 05/01/18 [phone]

## 2019-04-17 ENCOUNTER — Emergency Department (HOSPITAL_BASED_OUTPATIENT_CLINIC_OR_DEPARTMENT_OTHER)
Admission: EM | Admit: 2019-04-17 | Discharge: 2019-04-17 | Disposition: A | Discharge status: Home / Self Care | Payer: Medicaid Other | Attending: Emergency Medicine | Admitting: Emergency Medicine

## 2019-04-17 ENCOUNTER — Other Ambulatory Visit: Payer: Self-pay

## 2019-04-17 ENCOUNTER — Encounter (HOSPITAL_BASED_OUTPATIENT_CLINIC_OR_DEPARTMENT_OTHER): Payer: Self-pay | Admitting: Emergency Medicine

## 2019-04-17 DIAGNOSIS — E119 Type 2 diabetes mellitus without complications: Secondary | ICD-10-CM | POA: Diagnosis not present

## 2019-04-17 DIAGNOSIS — Z79899 Other long term (current) drug therapy: Secondary | ICD-10-CM | POA: Insufficient documentation

## 2019-04-17 DIAGNOSIS — T7840XA Allergy, unspecified, initial encounter: Secondary | ICD-10-CM | POA: Diagnosis present

## 2019-04-17 DIAGNOSIS — L509 Urticaria, unspecified: Secondary | ICD-10-CM

## 2019-04-17 MED ORDER — FAMOTIDINE 20 MG PO TABS: 20.0000 mg | ORAL_TABLET | Freq: Two times a day (BID) | ORAL | 0 refills | Status: DC

## 2019-04-17 MED ORDER — SODIUM CHLORIDE 0.9 % IV SOLN
INTRAVENOUS | Status: DC | PRN
  Administered 2019-04-17: 250 mL via INTRAVENOUS

## 2019-04-17 MED ORDER — FAMOTIDINE IN NACL 20-0.9 MG/50ML-% IV SOLN
20.0000 mg | Freq: Once | INTRAVENOUS | Status: AC
  Administered 2019-04-17: 11:00:00 20 mg via INTRAVENOUS
  Filled 2019-04-17: qty 50

## 2019-04-17 MED ORDER — DEXAMETHASONE SODIUM PHOSPHATE 10 MG/ML IJ SOLN
10.0000 mg | Freq: Once | INTRAMUSCULAR | Status: AC
  Administered 2019-04-17: 10 mg via INTRAVENOUS
  Filled 2019-04-17: qty 1

## 2019-04-17 MED ORDER — DIPHENHYDRAMINE HCL 25 MG PO TABS: 25.0000 mg | ORAL_TABLET | Freq: Four times a day (QID) | ORAL | 0 refills | Status: DC

## 2019-04-17 MED ORDER — DIPHENHYDRAMINE HCL 50 MG/ML IJ SOLN
12.5000 mg | Freq: Once | INTRAMUSCULAR | Status: AC
  Administered 2019-04-17: 12.5 mg via INTRAVENOUS

## 2019-04-17 MED ORDER — PREDNISONE 50 MG PO TABS: 50.0000 mg | ORAL_TABLET | Freq: Every day | ORAL | 0 refills | Status: DC

## 2019-04-17 MED ORDER — DIPHENHYDRAMINE HCL 50 MG/ML IJ SOLN
25.0000 mg | Freq: Once | INTRAMUSCULAR | Status: DC
  Filled 2019-04-17: qty 1

## 2019-04-17 MED ORDER — EPINEPHRINE 0.3 MG/0.3ML IJ SOAJ
0.3000 mg | Freq: Once | INTRAMUSCULAR | Status: AC
  Administered 2019-04-17: 0.3 mg via INTRAMUSCULAR
  Filled 2019-04-17: qty 0.3

## 2019-04-17 MED ORDER — EPINEPHRINE 0.3 MG/0.3ML IJ SOAJ: 0.3000 mg | INTRAMUSCULAR | 0 refills | Status: DC | PRN

## 2019-04-17 NOTE — ED Triage Notes (Signed)
Pt woke up covered in hives and itching. Her chin is notably swollen. C/o mild SOB. States this happened 2 weeks ago.

## 2019-04-17 NOTE — ED Notes (Signed)
Pt placed on cardiac monitor 

## 2019-04-17 NOTE — ED Provider Notes (Signed)
[facility] EMERGENCY DEPARTMENT Provider Note   CSN: [id number] Arrival date & time: 04/17/19  0900    History   Chief Complaint Chief Complaint  Patient presents with  . Allergic Reaction    HPI Kellie Fisher is a 38 y.o. female.     HPI Pt states she started having sx last night.  She developed hives all over.  They are itchy and painful.  No trouble with speech.  She feels a little short of breath but is not sure if it is from being anxious.  She has ahd this in the past and was given a steroid shot.  She saw her primary doctor at that time.  No new medications.  No new foods.  No new detergents or solutions.  Not sure what is triggering this.  Past Medical History:  Diagnosis Date  . Ankle fracture   . Anxiety   . Depression   . Diabetes mellitus without complication ([place])   . Eczema   . Migraine   . PTSD (post-traumatic stress disorder)   . Tumor containing calcium     Patient Active Problem List   Diagnosis Date Noted  . Cellulitis of oral soft tissues 04/13/2017  . Depression with anxiety 04/13/2017    Past Surgical History:  Procedure Laterality Date  . CHOLECYSTECTOMY    . HERNIA REPAIR       OB History   No obstetric history on file.      Home Medications    Prior to Admission medications   Medication Sig Start Date End Date Taking? Authorizing Provider  acetaminophen (TYLENOL) 500 MG tablet Take 1,000 mg by mouth every 6 (six) hours as needed. For pain.    [provider]  albuterol (PROVENTIL HFA;VENTOLIN HFA) 108 (90 Base) MCG/ACT inhaler Inhale 1-2 puffs into the lungs every 6 (six) hours as needed for wheezing or shortness of breath. 10/17/17   Zigmund Gottron, NP  benzonatate (TESSALON) 100 MG capsule Take 1 capsule (100 mg total) by mouth every 8 (eight) hours. Patient not taking: Reported on 04/30/2018 10/17/17   Augusto Gamble B, NP  BYDUREON 2 MG PEN Inject 2 mg into the skin once a week. 02/28/17   [provider]  chlorhexidine (PERIDEX) 0.12 % solution Use as directed 15 mLs in the mouth or throat 2 (two) times daily. Patient not taking: Reported on 04/30/2018 04/15/17   Regalado, Jerald Kief A, MD  dextromethorphan-guaiFENesin (MUCINEX DM) 30-600 MG 12hr tablet Take 1 tablet by mouth 2 (two) times daily. 10/19/17   Zigmund Gottron, NP  diphenhydrAMINE (BENADRYL) 25 MG tablet Take 1 tablet (25 mg total) by mouth every 6 (six) hours. 04/17/19   Dorie Rank, MD  EMGALITY 120 MG/ML SOAJ Inject 120 mg into the skin every 30 (thirty) days. 04/01/18   [provider]  EPINEPHrine (EPIPEN 2-PAK) 0.3 mg/0.3 mL IJ SOAJ injection Inject 0.3 mLs (0.3 mg total) into the muscle as needed for anaphylaxis. 04/17/19   Dorie Rank, MD  escitalopram (LEXAPRO) 20 MG tablet Take 20 mg by mouth daily. 03/30/17   [provider]  etonogestrel-ethinyl estradiol (NUVARING) 0.12-0.015 MG/24HR vaginal ring Place 1 each vaginally every 28 (twenty-eight) days. Insert vaginally and leave in place for 3 consecutive weeks, then remove for 1 week.    [provider]  famotidine (PEPCID) 20 MG tablet Take 1 tablet (20 mg total) by mouth 2 (two) times daily. 04/17/19   Dorie Rank, MD  gabapentin (NEURONTIN) 300  MG capsule Take 300 mg by mouth 3 (three) times daily. 03/11/17   [provider]  hydrOXYzine (ATARAX/VISTARIL) 10 MG tablet Take 10 mg by mouth 3 (three) times daily as needed for itching or anxiety.     [provider]  ipratropium (ATROVENT) 0.03 % nasal spray Place 2 sprays into both nostrils every 12 (twelve) hours. 10/19/17   Zigmund Gottron, NP  lamoTRIgine (LAMICTAL) 150 MG tablet Take 150 mg by mouth at bedtime. 04/16/18   [provider]  loratadine (CLARITIN) 10 MG tablet Take 10 mg by mouth at bedtime.    [provider]  montelukast (SINGULAIR) 10 MG tablet Take 10 mg by mouth daily. 03/30/17   [provider]  predniSONE (DELTASONE) 50 MG tablet Take 1  tablet (50 mg total) by mouth daily. 04/17/19   Dorie Rank, MD  sodium chloride (OCEAN) 0.65 % SOLN nasal spray Place 1 spray into both nostrils as needed for congestion. Patient not taking: Reported on 04/30/2018 10/19/17   Augusto Gamble B, NP  traZODone (DESYREL) 50 MG tablet Take 50 mg by mouth at bedtime. 04/16/18   [provider]    Family History No family history on file.  Social History Social History   Tobacco Use  . Smoking status: Never Smoker  . Smokeless tobacco: Never Used  Substance Use Topics  . Alcohol use: No    Comment: occasional  . Drug use: No     Allergies   Azithromycin and Benzoyl peroxide   Review of Systems Review of Systems  All other systems reviewed and are negative.    Physical Exam Updated Vital Signs BP 124/68 (BP Location: Right Arm)   Pulse (!) 110   Temp 98.7 F (37.1 C) (Oral)   Resp 18   Ht 1.6 m (5' 3" )   Wt 136.1 kg   SpO2 100%   BMI 53.14 kg/m   Physical Exam Vitals signs and nursing note reviewed.  Constitutional:      General: She is not in acute distress.    Appearance: She is well-developed.  HENT:     Head: Normocephalic and atraumatic.     Right Ear: External ear normal.     Left Ear: External ear normal.     Mouth/Throat:     Comments: No edema, no tongue edema Eyes:     General: No scleral icterus.       Right eye: No discharge.        Left eye: No discharge.     Conjunctiva/sclera: Conjunctivae normal.  Neck:     Musculoskeletal: Neck supple.     Trachea: No tracheal deviation.  Cardiovascular:     Rate and Rhythm: Normal rate and regular rhythm.  Pulmonary:     Effort: Pulmonary effort is normal. No respiratory distress.     Breath sounds: Normal breath sounds. No stridor. No wheezing or rales.  Abdominal:     General: Bowel sounds are normal. There is no distension.     Palpations: Abdomen is soft.     Tenderness: There is no abdominal tenderness. There is no guarding or rebound.   Musculoskeletal:        General: No tenderness.  Skin:    General: Skin is warm and dry.     Findings: No rash.     Comments: urticaria diffusely  Neurological:     Mental Status: She is alert.     Cranial Nerves: No cranial nerve deficit (no facial droop, extraocular  movements intact, no slurred speech).     Sensory: No sensory deficit.     Motor: No abnormal muscle tone or seizure activity.     Coordination: Coordination normal.      ED Treatments / Results  Labs (all labs ordered are listed, but only abnormal results are displayed) Labs Reviewed - No data to display  EKG None  Radiology No results found.  Procedures Procedures (including critical care time)  Medications Ordered in ED Medications  0.9 %  sodium chloride infusion ( Intravenous Stopped 04/17/19 1400)  dexamethasone (DECADRON) injection 10 mg (10 mg Intravenous Given 04/17/19 1047)  famotidine (PEPCID) IVPB 20 mg premix ( Intravenous Stopped 04/17/19 1124)  diphenhydrAMINE (BENADRYL) injection 12.5 mg (12.5 mg Intravenous Given 04/17/19 1127)  EPINEPHrine (EPI-PEN) injection 0.3 mg (0.3 mg Intramuscular Given 04/17/19 1349)     Initial Impression / Assessment and Plan / ED Course  I have reviewed the triage vital signs and the nursing notes.  Pertinent labs & imaging results that were available during my care of the patient were reviewed by me and considered in my medical decision making (see chart for details).  Clinical Course as of Apr 16 1440  Sat Apr 17, 2019  1343 Patient has not noticed any significant improvement from her initial treatment.  She does still have persistent urticaria.  We will try a dose of epinephrine to see if that gives her more significant symptomatic relief.  Still without signs of any breathing difficulty or speech difficulty.   [JK]    Clinical Course User Index [JK] Dorie Rank, MD    Patient presented with diffuse urticaria.  Fortunately without any signs of airway  compromise.  Patient was treated with Benadryl, Pepcid and Solu-Medrol.  No significant improvement and she was still very uncomfortable.  Epinephrine injection was given to see if she had better symptomatic relief.  Patient has not noticed any significant change although on my exam it does appear less erythematous.  Patient appears stable for discharge.  I recommend outpatient follow-up with an allergist.  We will have her continue a course of antihistamines and steroids.  Warning signs and precautions discussed.  Final Clinical Impressions(s) / ED Diagnoses   Final diagnoses:  Urticaria    ED Discharge Orders         Ordered    diphenhydrAMINE (BENADRYL) 25 MG tablet  Every 6 hours     04/17/19 1439    famotidine (PEPCID) 20 MG tablet  2 times daily     04/17/19 1439    predniSONE (DELTASONE) 50 MG tablet  Daily     04/17/19 1439    EPINEPHrine (EPIPEN 2-PAK) 0.3 mg/0.3 mL IJ SOAJ injection  As needed     04/17/19 1439           Dorie Rank, MD 04/17/19 1442

## 2019-04-17 NOTE — Discharge Instructions (Signed)
Take the medications as prescribed, follow up with an allergist for further evaluatoion

## 2019-05-04 ENCOUNTER — Ambulatory Visit (INDEPENDENT_AMBULATORY_CARE_PROVIDER_SITE_OTHER): Payer: Medicaid Other | Admitting: Allergy and Immunology

## 2019-05-04 ENCOUNTER — Encounter: Payer: Self-pay | Admitting: Allergy and Immunology

## 2019-05-04 ENCOUNTER — Other Ambulatory Visit: Payer: Self-pay

## 2019-05-04 VITALS — BP 142/72 | HR 124 | Temp 98.3°F | Resp 16 | Ht 63.5 in | Wt 312.4 lb

## 2019-05-04 DIAGNOSIS — Z8709 Personal history of other diseases of the respiratory system: Secondary | ICD-10-CM

## 2019-05-04 DIAGNOSIS — T7840XA Allergy, unspecified, initial encounter: Secondary | ICD-10-CM | POA: Diagnosis not present

## 2019-05-04 DIAGNOSIS — L501 Idiopathic urticaria: Secondary | ICD-10-CM | POA: Diagnosis not present

## 2019-05-04 MED ORDER — FAMOTIDINE 40 MG PO TABS: 40.0000 mg | ORAL_TABLET | Freq: Two times a day (BID) | ORAL | 5 refills | Status: DC

## 2019-05-04 NOTE — Progress Notes (Signed)
- [place] [facility] [place] [facility] [place]   Dear Dr. Darron Doom,  Thank you for referring Kellie Fisher to the [facility] on 05/04/2019.   Below is a summation of this patient's evaluation and recommendations.  Thank you for your referral. I will keep you informed about this patient's response to treatment.   If you have any questions please do not hesitate to contact me.   Sincerely,  Kellie Prows, MD Allergy / Immunology [place]   ______________________________________________________________________    NEW PATIENT NOTE  Referring Provider: Hayden Rasmussen, MD Primary Provider: Hayden Rasmussen, MD Date of office visit: 05/04/2019    Subjective:   Chief Complaint:  Kellie Fisher (DOB: 11/28/1980) is a 38 y.o. female who presents to the clinic on 05/04/2019 with a chief complaint of Urticaria .     HPI: Kellie Fisher presents to this clinic in evaluation of hives.  For approximately 6 weeks Kellie Fisher has been having daily red raised itchy lesions across her body that never heal with scar or hyperpigmentation, and usually resolve within a day or several days, and are described as both itchy and hurting, unassociated with any systemic or constitutional symptoms, and without any obvious trigger.  She has been to see her primary care doctor on 2 occasions and has been to the emergency room once.  She has received 3 courses of systemic steroid and she does think that she actually gets better while using a systemic steroid.  Her last systemic steroid administration was 5 days ago.  She was also given an injection of epinephrine in the emergency room which she is not really sure has helped her very much.  Currently she takes Benadryl twice a day and she has been using longstanding hydroxyzine for anxiety twice a day.  There is no obvious provoking factor giving rise to this  issue.  She has not had a significant environmental change, started a new medication, started a new supplement, started a new over-the-counter herbs, and she is not pregnant as she is not sexually active.  She has not had a new medical issue that has arisen in 2020.  She has a long history of allergic rhinoconjunctivitis with nasal congestion and sneezing and itchy eyes most of her life.  This may be flared by exposure to the outdoors but she has these problems all year.  She uses a nasal steroid on occasion and continues to use montelukast consistently.  She has a history of very intermittent asthma for which she rarely uses a short acting bronchodilator averaging out to just a few times per year.  She has a history of developing sneezing and some irritated throat when eating chocolate.  She can eat small amounts of chocolate but if she eats a very dark chocolate she develops the symptoms described.  Past Medical History:  Diagnosis Date  . Ankle fracture   . Anxiety   . Depression   . Diabetes mellitus without complication ([place])   . Eczema   . Migraine   . PTSD (post-traumatic stress disorder)   . Tumor containing calcium     Past Surgical History:  Procedure Laterality Date  . CHOLECYSTECTOMY    . HERNIA REPAIR      Allergies as of 05/04/2019      Reactions   Azithromycin Other (See Comments)   Makes her crazy    Benzoyl Peroxide  Medication List      acetaminophen 500 MG tablet Commonly known as: TYLENOL Take 1,000 mg by mouth every 6 (six) hours as needed. For pain.   albuterol 108 (90 Base) MCG/ACT inhaler Commonly known as: VENTOLIN HFA Inhale 1-2 puffs into the lungs every 6 (six) hours as needed for wheezing or shortness of breath.   benzonatate 100 MG capsule Commonly known as: TESSALON Take 1 capsule (100 mg total) by mouth every 8 (eight) hours.   buPROPion 300 MG 24 hr tablet Commonly known as: WELLBUTRIN XL TAKE 1 TABLET BY MOUTH EVERY DAY IN THE  MORNING   Bydureon 2 MG Pen Generic drug: Exenatide ER Inject 2 mg into the skin once a week.   chlorhexidine 0.12 % solution Commonly known as: PERIDEX Use as directed 15 mLs in the mouth or throat 2 (two) times daily.   cyclobenzaprine 10 MG tablet Commonly known as: FLEXERIL TK 1 T PO QHS   dextromethorphan-guaiFENesin 30-600 MG 12hr tablet Commonly known as: MUCINEX DM Take 1 tablet by mouth 2 (two) times daily.   diphenhydrAMINE 25 MG tablet Commonly known as: BENADRYL Take 1 tablet (25 mg total) by mouth every 6 (six) hours.   Emgality 120 MG/ML Soaj Generic drug: Galcanezumab-gnlm Inject 120 mg into the skin every 30 (thirty) days.   EPINEPHrine 0.3 mg/0.3 mL Soaj injection Commonly known as: EpiPen 2-Pak Inject 0.3 mLs (0.3 mg total) into the muscle as needed for anaphylaxis.   escitalopram 20 MG tablet Commonly known as: LEXAPRO Take 20 mg by mouth daily.   etonogestrel-ethinyl estradiol 0.12-0.015 MG/24HR vaginal ring Commonly known as: Level Green 1 each vaginally every 28 (twenty-eight) days. Insert vaginally and leave in place for 3 consecutive weeks, then remove for 1 week.   famotidine 40 MG tablet Commonly known as: PEPCID Take 1 tablet (40 mg total) by mouth 2 (two) times daily.   fluticasone 50 MCG/ACT nasal spray Commonly known as: FLONASE SHAKE LQ AND U 2 SPRAYS IEN D   gabapentin 300 MG capsule Commonly known as: NEURONTIN Take 300 mg by mouth 3 (three) times daily.   hydrOXYzine 10 MG tablet Commonly known as: ATARAX/VISTARIL Take 10 mg by mouth 3 (three) times daily as needed for itching or anxiety.   ipratropium 0.03 % nasal spray Commonly known as: Atrovent Place 2 sprays into both nostrils every 12 (twelve) hours.   lamoTRIgine 150 MG tablet Commonly known as: LAMICTAL Take 150 mg by mouth at bedtime.   lamoTRIgine 100 MG tablet Commonly known as: LAMICTAL Take 100 mg by mouth daily.   loratadine 10 MG tablet Commonly  known as: CLARITIN Take 10 mg by mouth at bedtime.   meloxicam 15 MG tablet Commonly known as: MOBIC Take 15 mg by mouth daily.   montelukast 10 MG tablet Commonly known as: SINGULAIR Take 10 mg by mouth daily.     rizatriptan 10 MG disintegrating tablet Commonly known as: MAXALT-MLT TAKE 1 TABLET BY MOUTH AT ONSET OF MIGRAINE, MAY REPEAT IN 2 HOURS   sodium chloride 0.65 % Soln nasal spray Commonly known as: OCEAN Place 1 spray into both nostrils as needed for congestion.   traZODone 50 MG tablet Commonly known as: DESYREL Take 50 mg by mouth at bedtime.   Xulane 150-35 MCG/24HR transdermal patch Generic drug: norelgestromin-ethinyl estradiol APPLY 1 PATCH TRANSDERMALLY WEEKLY FOR 21 DAYS OFF 1 WEEK THEN REPEAT ON THE SAME DAY EVERY WEEK       Review of systems negative except as  noted in HPI / PMHx or noted below:  Review of Systems  Constitutional: Negative.   HENT: Negative.   Eyes: Negative.   Respiratory: Negative.   Cardiovascular: Negative.   Gastrointestinal: Negative.   Genitourinary: Negative.   Musculoskeletal: Negative.   Skin: Negative.   Neurological: Negative.   Endo/Heme/Allergies: Negative.   Psychiatric/Behavioral: Negative.     History reviewed. No pertinent family history.  Social History   Socioeconomic History  . Marital status: Married    Spouse name: Not on file  . Number of children: Not on file  . Years of education: Not on file  . Highest education level: Not on file  Occupational History  . Not on file  Social Needs  . Financial resource strain: Not on file  . Food insecurity    Worry: Not on file    Inability: Not on file  . Transportation needs    Medical: Not on file    Non-medical: Not on file  Tobacco Use  . Smoking status: Never Smoker  . Smokeless tobacco: Never Used  Substance and Sexual Activity  . Alcohol use: No    Comment: occasional  . Drug use: No  . Sexual activity: Yes    Birth control/protection:  I.U.D.  Lifestyle  . Physical activity    Days per week: Not on file    Minutes per session: Not on file  . Stress: Not on file  Relationships  . Social Herbalist on phone: Not on file    Gets together: Not on file    Attends religious service: Not on file    Active member of club or organization: Not on file    Attends meetings of clubs or organizations: Not on file    Relationship status: Not on file  . Intimate partner violence    Fear of current or ex partner: Not on file    Emotionally abused: Not on file    Physically abused: Not on file    Forced sexual activity: Not on file  Other Topics Concern  . Not on file  Social History Narrative  . Not on file    Environmental and Social history  Lives in a house with a dry environment, no animals located inside the household, no carpet in the bedroom, no plastic on the bed, no plastic on the pillow, no smoking ongoing with inside the household.  Objective:   Vitals:   05/04/19 1322  BP: (!) 142/72  Pulse: (!) 124  Resp: 16  Temp: 98.3 F (36.8 C)  SpO2: 98%   Height: 5' 3.5" (161.3 cm) Weight: (!) 312 lb 6.4 oz (141.7 kg)  Physical Exam Constitutional:      Appearance: She is not diaphoretic.  HENT:     Head: Normocephalic.     Right Ear: Tympanic membrane, ear canal and external ear normal.     Left Ear: Tympanic membrane, ear canal and external ear normal.     Nose: Nose normal. No mucosal edema or rhinorrhea.     Mouth/Throat:     Pharynx: Uvula midline. No oropharyngeal exudate.  Eyes:     Conjunctiva/sclera: Conjunctivae normal.  Neck:     Thyroid: No thyromegaly.     Trachea: Trachea normal. No tracheal tenderness or tracheal deviation.  Cardiovascular:     Rate and Rhythm: Normal rate and regular rhythm.     Heart sounds: S1 normal and S2 normal. Murmur (Systolic) present.  Pulmonary:     Effort:  No respiratory distress.     Breath sounds: Normal breath sounds. No stridor. No wheezing or  rales.  Lymphadenopathy:     Head:     Right side of head: No tonsillar adenopathy.     Left side of head: No tonsillar adenopathy.     Cervical: No cervical adenopathy.  Skin:    Findings: Rash (Diffuse global red raised blanching urticarial lesions.) present. No erythema.     Nails: There is no clubbing.   Neurological:     Mental Status: She is alert.     Diagnostics: Allergy skin tests were not performed.   Review of blood tests forwarded by Dr. Horald Pollen dated 19 April 2019 refers to negative RF, thyroid peroxidase antibody less than 9 U/mL, C3 246 mg/DL, C4 46 mg/DL, negative ANA, C-reactive protein 3.02 mg/DL, TSH 1.02 microunits/L, negative hepatitis C antibody.  Assessment and Plan:    1. Allergic reaction, initial encounter   2. Idiopathic urticaria   3. History of allergic rhinitis   4. History of asthma     1.  Every day utilize the following medications:   A. Cetirizine 10 mg - 1-2 tablets 1-2 times per day  B. Famotidine 40 mg - 1 tablet 2 times per day  C. montelukast 10 mg - 1 tablet 1 time per day  2. Can add OTC benadryl and albuterol HFA if needed  3. Can add epi-pen, benadryl, MD/ER evaluation for allergic reaction  4. Blood - CBC w/diff, CMP, T4, alpha-gal panel   5. Prednisone 10 mg - 1 tablet 1 time per day. Blood sugars.   6. Return to clinic next week. 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  The etiologic factor responsible for her urticaria is unknown at this point in time.  I will check blood tests in investigation of major organ function and some atopic disease.  She will utilize a combination of therapy as noted above which includes low-dose systemic steroids utilizing prednisone 10 mg daily.  I will see her back in this clinic next week and will make a  determination about further evaluation and treatment based upon her response to this approach.  Hopefully this immunological hyperreactivity will burnout at some point in time or the process responsible for this activity will declare itself to the point where it can be identified and treated.  Kellie Prows, MD Allergy / Immunology [facility]

## 2019-05-04 NOTE — Patient Instructions (Addendum)
  1.  Every day utilize the following medications:   A. Cetirizine 10 mg - 1-2 tablets 1-2 times per day  B. Famotidine 40 mg - 1 tablet 2 times per day  C. montelukast 10 mg - 1 tablet 1 time per day  2. Can add OTC benadryl and albuterol HFA if needed  3. Can add epi-pen, benadryl, MD/ER evaluation for allergic reaction  4. Blood - CBC w/diff, CMP, T4, alpha-gal panel   5. Prednisone 10 mg - 1 tablet 1 time per day. Blood sugars.   6. Return to clinic next week. Call if problems

## 2019-05-05 ENCOUNTER — Encounter: Payer: Self-pay | Admitting: Allergy and Immunology

## 2019-05-10 LAB — COMPREHENSIVE METABOLIC PANEL
ALT: 34 IU/L — ABNORMAL HIGH (ref 0–32)
AST: 33 IU/L (ref 0–40)
Albumin/Globulin Ratio: 1.6 (ref 1.2–2.2)
Albumin: 3.7 g/dL — ABNORMAL LOW (ref 3.8–4.8)
Alkaline Phosphatase: 75 IU/L (ref 39–117)
BUN/Creatinine Ratio: 12 (ref 9–23)
BUN: 8 mg/dL (ref 6–20)
Bilirubin Total: 0.3 mg/dL (ref 0.0–1.2)
CO2: 18 mmol/L — ABNORMAL LOW (ref 20–29)
Calcium: 8.7 mg/dL (ref 8.7–10.2)
Chloride: 102 mmol/L (ref 96–106)
Creatinine, Ser: 0.67 mg/dL (ref 0.57–1.00)
GFR calc Af Amer: 129 mL/min/{1.73_m2} (ref >59)
GFR calc non Af Amer: 112 mL/min/{1.73_m2} (ref >59)
Globulin, Total: 2.3 g/dL (ref 1.5–4.5)
Glucose: 275 mg/dL — ABNORMAL HIGH (ref 65–99)
Potassium: 4.1 mmol/L (ref 3.5–5.2)
Sodium: 136 mmol/L (ref 134–144)
Total Protein: 6 g/dL (ref 6.0–8.5)

## 2019-05-10 LAB — ALPHA-GAL PANEL
Alpha Gal IgE*: <0.1 kU/L (ref <0.10)
Beef (Bos spp) IgE: <0.1 kU/L (ref <0.35)
Class Interpretation: 0
Class Interpretation: 0
Class Interpretation: 0
Lamb/Mutton (Ovis spp) IgE: <0.1 kU/L (ref <0.35)
Pork (Sus spp) IgE: <0.1 kU/L (ref <0.35)

## 2019-05-10 LAB — T4: T4, Total: 10.7 ug/dL (ref 4.5–12.0)

## 2019-05-11 ENCOUNTER — Other Ambulatory Visit: Payer: Self-pay

## 2019-05-11 ENCOUNTER — Ambulatory Visit (INDEPENDENT_AMBULATORY_CARE_PROVIDER_SITE_OTHER): Payer: Medicaid Other | Admitting: Allergy and Immunology

## 2019-05-11 VITALS — BP 124/82 | HR 108 | Resp 18

## 2019-05-11 DIAGNOSIS — L501 Idiopathic urticaria: Secondary | ICD-10-CM | POA: Diagnosis not present

## 2019-05-11 DIAGNOSIS — T7840XD Allergy, unspecified, subsequent encounter: Secondary | ICD-10-CM

## 2019-05-11 MED ORDER — PREDNISONE 5 MG PO TABS: ORAL_TABLET | ORAL | 0 refills | Status: AC

## 2019-05-11 NOTE — Patient Instructions (Addendum)
  1.  Every day utilize the following medications:   A. Cetirizine 10 mg - 1-2 tablets 1-2 times per day (MAX = 40 mg)  B. Famotidine 40 mg - 1 tablet 2 times per day  C. montelukast 10 mg - 1 tablet 1 time per day  2. Can add OTC benadryl and albuterol HFA if needed  3. Can add epi-pen, benadryl, MD/ER evaluation for allergic reaction  4. Taper prednisone 5 mg tablets using the following plan:    A.  1-1/2 tabs daily x 10 days, then  B.  1 tablet daily for 10 days, then  C.  1/2 tablet daily for 10 days  5. Return to clinic in 30 days or earlier if problem

## 2019-05-11 NOTE — Progress Notes (Signed)
[place]   Follow-up Note  Referring Provider: Hayden Rasmussen, MD Primary Provider: Hayden Rasmussen, MD Date of Office Visit: 05/11/2019  Subjective:   Kellie Fisher (DOB: July 28, 1981) is a 38 y.o. 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  At this point she has no urticaria.  She has no pruritus.  She continues to use a large collection of medical therapy prescribed during her last visit including prednisone 10 mg daily.  Allergies as of 05/11/2019      Reactions   Azithromycin Other (See Comments)   Makes her crazy    Benzoyl Peroxide       Medication List      acetaminophen 500 MG tablet Commonly known as: TYLENOL Take 1,000 mg by mouth every 6 (six) hours as needed. For pain.   albuterol 108 (90 Base) MCG/ACT inhaler Commonly known as: VENTOLIN HFA Inhale 1-2 puffs into the lungs every 6 (six) hours as needed for wheezing or shortness of breath.   benzonatate 100 MG capsule Commonly known as: TESSALON Take 1 capsule (100 mg total) by mouth every 8 (eight) hours.   buPROPion 300 MG 24 hr tablet Commonly known as: WELLBUTRIN XL TAKE 1 TABLET BY MOUTH EVERY DAY IN THE MORNING   Bydureon 2 MG Pen Generic drug: Exenatide ER Inject 2 mg into the skin once a week.   chlorhexidine 0.12 % solution Commonly known as: PERIDEX Use as directed 15 mLs in the mouth or throat 2 (two) times daily.   cyclobenzaprine 10 MG tablet Commonly known as: FLEXERIL TK 1 T PO QHS   dextromethorphan-guaiFENesin 30-600 MG 12hr tablet Commonly known as: MUCINEX DM Take 1 tablet by mouth 2 (two) times daily.   diphenhydrAMINE 25 MG tablet Commonly known as: BENADRYL Take 1 tablet (25 mg total)  by mouth every 6 (six) hours.   Emgality 120 MG/ML Soaj Generic drug: Galcanezumab-gnlm Inject 120 mg into the skin every 30 (thirty) days.   EPINEPHrine 0.3 mg/0.3 mL Soaj injection Commonly known as: EpiPen 2-Pak Inject 0.3 mLs (0.3 mg total) into the muscle as needed for anaphylaxis.   escitalopram 20 MG tablet Commonly known as: LEXAPRO Take 20 mg by mouth daily.   etonogestrel-ethinyl estradiol 0.12-0.015 MG/24HR vaginal ring Commonly known as: [place] 1 each vaginally every 28 (twenty-eight) days. Insert vaginally and leave in place for 3 consecutive weeks, then remove for 1 week.   famotidine 40 MG tablet Commonly known as: PEPCID Take 1 tablet (40 mg total) by mouth 2 (two) times daily.   fluticasone 50 MCG/ACT nasal spray Commonly known as: FLONASE SHAKE LQ AND U 2 SPRAYS IEN D   gabapentin 300 MG capsule Commonly known as: NEURONTIN Take 300 mg by mouth 3 (three) times daily.   hydrOXYzine 10 MG tablet Commonly known as: ATARAX/VISTARIL Take 10 mg by mouth 3 (three) times daily as needed for itching or anxiety.   ipratropium 0.03 % nasal spray Commonly known as: Atrovent Place 2 sprays into both nostrils every 12 (twelve) hours.   lamoTRIgine 150 MG tablet Commonly known as: LAMICTAL Take 150 mg by mouth at bedtime.   lamoTRIgine 100 MG tablet Commonly known as: LAMICTAL Take 100 mg by mouth daily.   loratadine 10  MG tablet Commonly known as: CLARITIN Take 10 mg by mouth at bedtime.   meloxicam 15 MG tablet Commonly known as: MOBIC Take 15 mg by mouth daily.   montelukast 10 MG tablet Commonly known as: SINGULAIR Take 10 mg by mouth daily.   predniSONE 50 MG tablet Commonly known as: DELTASONE Take 1 tablet (50 mg total) by mouth daily.   rizatriptan 10 MG disintegrating tablet Commonly known as: MAXALT-MLT TAKE 1 TABLET BY MOUTH AT ONSET OF MIGRAINE, MAY REPEAT IN 2 HOURS   sodium chloride 0.65 % Soln nasal spray Commonly known as:  OCEAN Place 1 spray into both nostrils as needed for congestion.   traZODone 50 MG tablet Commonly known as: DESYREL Take 50 mg by mouth at bedtime.   Xulane 150-35 MCG/24HR transdermal patch Generic drug: norelgestromin-ethinyl estradiol APPLY 1 PATCH TRANSDERMALLY WEEKLY FOR 21 DAYS OFF 1 WEEK THEN REPEAT ON THE SAME DAY EVERY WEEK       Past Medical History:  Diagnosis Date  . Ankle fracture   . Anxiety   . Depression   . Diabetes mellitus without complication ([place])   . Eczema   . Migraine   . PTSD (post-traumatic stress disorder)   . Tumor containing calcium     Past Surgical History:  Procedure Laterality Date  . CHOLECYSTECTOMY    . HERNIA REPAIR      Review of systems negative except as noted in HPI / PMHx or noted below:  Review of Systems  Constitutional: Negative.   HENT: Negative.   Eyes: Negative.   Respiratory: Negative.   Cardiovascular: Negative.   Gastrointestinal: Negative.   Genitourinary: Negative.   Musculoskeletal: Negative.   Skin: Negative.   Neurological: Negative.   Endo/Heme/Allergies: Negative.   Psychiatric/Behavioral: Negative.      Objective:   Vitals:   05/11/19 1334  BP: 124/82  Pulse: (!) 108  Resp: 18  SpO2: 97%          Physical Exam Skin:    Findings: Rash (No urticaria.  Multiple excoriated papules.) present.     Diagnostics:    Results of blood tests obtained 04 May 2019 identified creatinine 0.67 mg/DL, glucose 275 mg/DL, AST 33 U/L, ALT 34 U/L, negative alpha gal panel.  Assessment and Plan:   1. Allergic reaction, subsequent encounter   2. Idiopathic urticaria     1.  Every day utilize the following medications:   A. Cetirizine 10 mg - 1-2 tablets 1-2 times per day (MAX = 40 mg)  B. Famotidine 40 mg - 1 tablet 2 times per day  C. montelukast 10 mg - 1 tablet 1 time per day  2. Can add OTC benadryl and albuterol HFA if needed  3. Can add epi-pen, benadryl, MD/ER evaluation for allergic  reaction  4. Taper prednisone 5 mg tablets using the following plan:    A.  1-1/2 tabs daily x 10 days, then  B.  1 tablet daily for 10 days, then  C.  1/2 tablet daily for 10 days  5. Return to clinic in 30 days or earlier if problem  It does appear that Kellie Fisher's immunological hyperreactivity is coming under better control at this point in time and we will now slowly taper down her low-dose systemic steroids as noted above.  I will see her back in this clinic in 30 days, when she completes her prednisone, or earlier if there is a problem.  Allena Katz, MD Allergy / Immunology [facility]

## 2019-05-12 ENCOUNTER — Encounter: Payer: Self-pay | Admitting: Allergy and Immunology

## 2019-06-21 ENCOUNTER — Ambulatory Visit (INDEPENDENT_AMBULATORY_CARE_PROVIDER_SITE_OTHER): Payer: Medicaid Other | Admitting: Allergy and Immunology

## 2019-06-21 ENCOUNTER — Other Ambulatory Visit: Payer: Self-pay

## 2019-06-21 ENCOUNTER — Encounter: Payer: Self-pay | Admitting: Allergy and Immunology

## 2019-06-21 VITALS — BP 142/82 | HR 100 | Resp 20

## 2019-06-21 DIAGNOSIS — L501 Idiopathic urticaria: Secondary | ICD-10-CM | POA: Diagnosis not present

## 2019-06-21 DIAGNOSIS — T7840XD Allergy, unspecified, subsequent encounter: Secondary | ICD-10-CM

## 2019-06-21 NOTE — Patient Instructions (Addendum)
  1.  Every day utilize the following medications:   A. Cetirizine 10 mg - 1-2 tablets 1-2 times per day (MAX = 40 mg)  B. Famotidine 40 mg - 1 tablet 2 times per day  C. montelukast 10 mg - 1 tablet 1 time per day  2. Can add OTC benadryl and albuterol HFA if needed  3. Can add epi-pen, benadryl, MD/ER evaluation for allergic reaction  4. Can use meloxicam if needed.  5.  Call clinic in 2 weeks:  Taper medicines noted above.  6.  Obtain fall flu vaccine (and COVID vaccine)

## 2019-06-21 NOTE — Progress Notes (Signed)
[place]   Follow-up Note  Referring Provider: Hayden Rasmussen, MD Primary Provider: Hayden Rasmussen, MD Date of Office Visit: 06/21/2019  Subjective:   Kellie Fisher (DOB: 07/09/1981) is a 38 y.o. female who returns to the [facility] on 06/21/2019 in re-evaluation of the following:  HPI: Navi returns to this clinic in reevaluation of urticaria.  Her last visit to this clinic was 11 May 2019.  She has slowly tapered off her systemic steroids and was been without any systemic steroids for the past week.  She has not had any recurrent hives.  She also self terminated the use of ibuprofen and meloxicam 2-1/2 weeks ago thinking that this may be a trigger for her hives.  Allergies as of 06/21/2019      Reactions   Azithromycin Other (See Comments)   Makes her crazy    Benzoyl Peroxide       Medication List    acetaminophen 500 MG tablet Commonly known as: TYLENOL Take 1,000 mg by mouth every 6 (six) hours as needed. For pain.   albuterol 108 (90 Base) MCG/ACT inhaler Commonly known as: VENTOLIN HFA Inhale 1-2 puffs into the lungs every 6 (six) hours as needed for wheezing or shortness of breath.   buPROPion 300 MG 24 hr tablet Commonly known as: WELLBUTRIN XL TAKE 1 TABLET BY MOUTH EVERY DAY IN THE MORNING   Bydureon 2 MG Pen Generic drug: Exenatide ER Inject 2 mg into the skin once a week.   cyclobenzaprine 10 MG tablet Commonly known as: FLEXERIL TK 1 T PO QHS   diphenhydrAMINE 25 MG tablet Commonly known as: BENADRYL Take 1 tablet (25 mg total) by mouth every 6 (six) hours.   Emgality 120 MG/ML Soaj Generic drug: Galcanezumab-gnlm Inject 120 mg into the skin every 30 (thirty) days.   EPINEPHrine 0.3 mg/0.3 mL Soaj injection Commonly known as: EpiPen 2-Pak Inject 0.3 mLs (0.3 mg total) into the muscle as needed for anaphylaxis.   escitalopram 20 MG tablet Commonly known as: LEXAPRO  Take 20 mg by mouth daily.   etonogestrel-ethinyl estradiol 0.12-0.015 MG/24HR vaginal ring Commonly known as: [place] 1 each vaginally every 28 (twenty-eight) days. Insert vaginally and leave in place for 3 consecutive weeks, then remove for 1 week.   famotidine 40 MG tablet Commonly known as: PEPCID Take 1 tablet (40 mg total) by mouth 2 (two) times daily.   fluticasone 50 MCG/ACT nasal spray Commonly known as: FLONASE SHAKE LQ AND U 2 SPRAYS IEN D   gabapentin 300 MG capsule Commonly known as: NEURONTIN Take 300 mg by mouth 3 (three) times daily.   hydrOXYzine 10 MG tablet Commonly known as: ATARAX/VISTARIL Take 10 mg by mouth 3 (three) times daily as needed for itching or anxiety.   lamoTRIgine 150 MG tablet Commonly known as: LAMICTAL Take 150 mg by mouth at bedtime.   montelukast 10 MG tablet Commonly known as: SINGULAIR Take 10 mg by mouth daily.   Prenatal Vitamin Plus Low Iron 27-1 MG Tabs Take 1 tablet by mouth daily.   rizatriptan 10 MG disintegrating tablet Commonly known as: MAXALT-MLT TAKE 1 TABLET BY MOUTH AT ONSET OF MIGRAINE, MAY REPEAT IN 2 HOURS   traZODone 50 MG tablet Commonly known as: DESYREL Take 50 mg by mouth at bedtime.       Past Medical History:  Diagnosis Date  . Ankle fracture   . Anxiety   .  Depression   . Diabetes mellitus without complication ([place])   . Eczema   . Migraine   . PTSD (post-traumatic stress disorder)   . Tumor containing calcium   . Urticaria     Past Surgical History:  Procedure Laterality Date  . CHOLECYSTECTOMY    . HERNIA REPAIR      Review of systems negative except as noted in HPI / PMHx or noted below:  Review of Systems  Constitutional: Negative.   HENT: Negative.   Eyes: Negative.   Respiratory: Negative.   Cardiovascular: Negative.   Gastrointestinal: Negative.   Genitourinary: Negative.   Musculoskeletal: Negative.   Skin: Negative.   Neurological: Negative.    Endo/Heme/Allergies: Negative.   Psychiatric/Behavioral: Negative.      Objective:   Vitals:   06/21/19 1514  BP: (!) 142/82  Pulse: 100  Resp: 20  SpO2: 96%          Physical Exam Skin:    Findings: No rash (No urticaria).     Diagnostics: none  Assessment and Plan:   1. Allergic reaction, subsequent encounter   2. Idiopathic urticaria     1.  Every day utilize the following medications:   A. Cetirizine 10 mg - 1-2 tablets 1-2 times per day (MAX = 40 mg)  B. Famotidine 40 mg - 1 tablet 2 times per day  C. montelukast 10 mg - 1 tablet 1 time per day  2. Can add OTC benadryl and albuterol HFA if needed  3. Can add epi-pen, benadryl, MD/ER evaluation for allergic reaction  4. Can use meloxicam if needed.  5.  Call clinic in 2 weeks:  Taper medicines noted above.  6.  Obtain fall flu vaccine (and COVID vaccine)  [name] is doing quite well at this point in time.  We will allow her to go a few more weeks on her current plan and she can reintroduce meloxicam, a COX-2 inhibitor to see if this is one of the triggers for her hives.  She will remain away from the use of Cox-1 inhibitors.  At 2 weeks she will contact me by telephone and we will make an attempt to taper down some of her medications at that point in time assuming she continues to do well.  Allena Katz, MD Allergy / Immunology [place]

## 2019-06-22 ENCOUNTER — Encounter: Payer: Self-pay | Admitting: Allergy and Immunology

## 2019-07-01 ENCOUNTER — Encounter: Payer: Self-pay | Admitting: Neurology

## 2019-07-21 ENCOUNTER — Other Ambulatory Visit: Payer: Self-pay | Admitting: Family Medicine

## 2019-07-21 DIAGNOSIS — R748 Abnormal levels of other serum enzymes: Secondary | ICD-10-CM

## 2019-07-21 DIAGNOSIS — K7581 Nonalcoholic steatohepatitis (NASH): Secondary | ICD-10-CM

## 2019-07-26 ENCOUNTER — Ambulatory Visit
Admission: RE | Admit: 2019-07-26 | Discharge: 2019-07-26 | Disposition: A | Discharge status: Home / Self Care | Payer: Medicaid Other | Source: Ambulatory Visit | Attending: Family Medicine | Admitting: Family Medicine

## 2019-07-26 DIAGNOSIS — R748 Abnormal levels of other serum enzymes: Secondary | ICD-10-CM

## 2019-07-26 DIAGNOSIS — K7581 Nonalcoholic steatohepatitis (NASH): Secondary | ICD-10-CM

## 2019-07-29 NOTE — Progress Notes (Signed)
NEUROLOGY CONSULTATION NOTE  [name] MRN: [id number] DOB: [birth year]  Referring provider: Horald Pollen, MD Primary care provider: Horald Pollen, MD  Reason for consult:  headaches  HISTORY OF PRESENT ILLNESS: Kellie Fisher is a 38 year old female with diabetes, NASH, dumping syndrome who presents for headache.  History supplemented by referring provider note.  Onset:  childhood Location:  Right periorbital region Quality: like a "red hot poker" Intensity:  7/10.  On a couple of occasions, she needed to go to the ED.  She denies new headache, thunderclap headache  Aura:  none Premonitory Phase:  Difficulty concentrating or thinking Postdrome:  Migraine "hangover" Associated symptoms:  Photophobia, phonophobia, nausea.  She denies associated vomiting, visual disturbance or unilateral numbness or weakness. Duration:  2 days without medication; 3 to 4 hours with rizatriptan Frequency:  4 a month since starting Emgality (every other day prior to [employer]) Frequency of abortive medication: 4 days a month Triggers:  Seasonal allergies Relieving factors:   Activity:  aggravates  Current NSAIDS:  Unable to take due to possible allergies (hives) Current analgesics:  Tylenol Current triptans:  Maxalt MLT 61m Current ergotamine:  none Current anti-emetic:  none Current muscle relaxants:  none Current anti-anxiolytic:  hydroxyzine Current sleep aide:  none Current Antihypertensive medications:  none Current Antidepressant medications:  escitalopram 222m bupropion ER 30067murrent Anticonvulsant medications:  Lamotrigine 150m67mabapentin 600mg65man 900mg 30mM Current anti-CGRP:  Emgality Current Vitamins/Herbal/Supplements:  prenatal Current Antihistamines/Decongestants:  Benadryl Allergy, Zyrtec, Flonase Other therapy:  none Hormone/birth control:  NuvaRing   Past NSAIDS:  Advil, Aleve Past analgesics:  Excedrin Migraine Past abortive triptans:  Sumatriptan  (made her drowsy or like she had the flu) Past abortive ergotamine:  none Past muscle relaxants:  none Past anti-emetic:  none Past antihypertensive medications:  none Past antidepressant medications:  none Past anticonvulsant medications: topiramate, Gabapentin  Past anti-CGRP:  Ubrelvy 100mg (63mctive) Past vitamins/Herbal/Supplements: none Past antihistamines/decongestants:  none Other past therapies:  none  Caffeine:  1 Red Bull daily; sometimes coffee Diet:  At least four 16 oz bottles of water daily. Exercise:  Not routine Depression:  yes; Anxiety:  yes Other pain:  Not really Sleep hygiene:  Daytime somnolence.  Supposed to have a sleep study. Family history of headache:  Both children; paternal grandmother Other family history:  2 children with autism.  1 child with Arnold Roselie Awkward malformation and 1 child with mega cisterna magna  PAST MEDICAL HISTORY: Past Medical History:  Diagnosis Date  . Ankle fracture   . Anxiety   . Depression   . Diabetes mellitus without complication ([facility])   [place]   . Migraine   . PTSD (post-traumatic stress disorder)   . Tumor containing calcium   . Urticaria     PAST SURGICAL HISTORY: Past Surgical History:  Procedure Laterality Date  . CHOLECYSTECTOMY    . HERNIA REPAIR      MEDICATIONS: Current Outpatient Medications on File Prior to Visit  Medication Sig Dispense Refill  . acetaminophen (TYLENOL) 500 MG tablet Take 1,000 mg by mouth every 6 (six) hours as needed. For pain.    . albutMarland Kitchenrol (PROVENTIL HFA;VENTOLIN HFA) 108 (90 Base) MCG/ACT inhaler Inhale 1-2 puffs into the lungs every 6 (six) hours as needed for wheezing or shortness of breath. 1 Inhaler 0  . buPROPion (WELLBUTRIN XL) 300 MG 24 hr tablet TAKE 1 TABLET BY MOUTH EVERY DAY IN THE MORNING    . BYDUREON 2 MG  PEN Inject 2 mg into the skin once a week.  6  . cyclobenzaprine (FLEXERIL) 10 MG tablet TK 1 T PO QHS    . diphenhydrAMINE (BENADRYL) 25 MG tablet Take 1  tablet (25 mg total) by mouth every 6 (six) hours. 20 tablet 0  . EMGALITY 120 MG/ML SOAJ Inject 120 mg into the skin every 30 (thirty) days.  0  . EPINEPHrine (EPIPEN 2-PAK) 0.3 mg/0.3 mL IJ SOAJ injection Inject 0.3 mLs (0.3 mg total) into the muscle as needed for anaphylaxis. 1 each 0  . escitalopram (LEXAPRO) 20 MG tablet Take 20 mg by mouth daily.  0  . etonogestrel-ethinyl estradiol (NUVARING) 0.12-0.015 MG/24HR vaginal ring Place 1 each vaginally every 28 (twenty-eight) days. Insert vaginally and leave in place for 3 consecutive weeks, then remove for 1 week.    . famotidine (PEPCID) 40 MG tablet Take 1 tablet (40 mg total) by mouth 2 (two) times daily. 60 tablet 5  . fluticasone (FLONASE) 50 MCG/ACT nasal spray SHAKE LQ AND U 2 SPRAYS IEN D    . gabapentin (NEURONTIN) 300 MG capsule Take 300 mg by mouth 3 (three) times daily.  6  . hydrOXYzine (ATARAX/VISTARIL) 10 MG tablet Take 10 mg by mouth 3 (three) times daily as needed for itching or anxiety.     . lamoTRIgine (LAMICTAL) 150 MG tablet Take 150 mg by mouth at bedtime.  1  . montelukast (SINGULAIR) 10 MG tablet Take 10 mg by mouth daily.  3  . Prenatal Vit-Fe Fumarate-FA (PRENATAL VITAMIN PLUS LOW IRON) 27-1 MG TABS Take 1 tablet by mouth daily.    . rizatriptan (MAXALT-MLT) 10 MG disintegrating tablet TAKE 1 TABLET BY MOUTH AT ONSET OF MIGRAINE, MAY REPEAT IN 2 HOURS    . traZODone (DESYREL) 50 MG tablet Take 50 mg by mouth at bedtime.  1   No current facility-administered medications on file prior to visit.     ALLERGIES: Allergies  Allergen Reactions  . Azithromycin Other (See Comments)    Makes her crazy   . Benzoyl Peroxide     FAMILY HISTORY: No family history on file..  SOCIAL HISTORY: Social History   Socioeconomic History  . Marital status: Married    Spouse name: Not on file  . Number of children: Not on file  . Years of education: Not on file  . Highest education level: Not on file  Occupational History   . Not on file  Social Needs  . Financial resource strain: Not on file  . Food insecurity    Worry: Not on file    Inability: Not on file  . Transportation needs    Medical: Not on file    Non-medical: Not on file  Tobacco Use  . Smoking status: Never Smoker  . Smokeless tobacco: Never Used  Substance and Sexual Activity  . Alcohol use: No    Comment: occasional  . Drug use: No  . Sexual activity: Yes    Birth control/protection: I.U.D.  Lifestyle  . Physical activity    Days per week: Not on file    Minutes per session: Not on file  . Stress: Not on file  Relationships  . Social Herbalist on phone: Not on file    Gets together: Not on file    Attends religious service: Not on file    Active member of club or organization: Not on file    Attends meetings of clubs or organizations: Not on file  Relationship status: Not on file  . Intimate partner violence    Fear of current or ex partner: Not on file    Emotionally abused: Not on file    Physically abused: Not on file    Forced sexual activity: Not on file  Other Topics Concern  . Not on file  Social History Narrative  . Not on file    REVIEW OF SYSTEMS: Constitutional: No fevers, chills, or sweats, no generalized fatigue, change in appetite Eyes: No visual changes, double vision, eye pain Ear, nose and throat: No hearing loss, ear pain, nasal congestion, sore throat Cardiovascular: No chest pain, palpitations Respiratory:  No shortness of breath at rest or with exertion, wheezes GastrointestinaI: No nausea, vomiting, diarrhea, abdominal pain, fecal incontinence Genitourinary:  No dysuria, urinary retention or frequency Musculoskeletal:  No neck pain, back pain Integumentary: No rash, pruritus, skin lesions Neurological: as above Psychiatric: No depression, insomnia, anxiety Endocrine: No palpitations, fatigue, diaphoresis, mood swings, change in appetite, change in weight, increased thirst  Hematologic/Lymphatic:  No purpura, petechiae. Allergic/Immunologic: no itchy/runny eyes, nasal congestion, recent allergic reactions, rashes  PHYSICAL EXAM: Blood pressure 120/77, pulse 89, height 5' 4.5" (1.638 m), weight (!) 310 lb 12.8 oz (141 kg), SpO2 98 %. General: No acute distress.  Patient appears well-groomed.  Head:  Normocephalic/atraumatic Eyes:  fundi examined but not visualized Neck: supple, no paraspinal tenderness, full range of motion Back: No paraspinal tenderness Heart: regular rate and rhythm Lungs: Clear to auscultation bilaterally. Vascular: No carotid bruits. Neurological Exam: Mental status: alert and oriented to person, place, and time, recent and remote memory intact, fund of knowledge intact, attention and concentration intact, speech fluent and not dysarthric, language intact. Cranial nerves: CN I: not tested CN II: pupils equal, round and reactive to light, visual fields intact CN III, IV, VI:  full range of motion, no nystagmus, no ptosis CN V: facial sensation intact CN VII: upper and lower face symmetric CN VIII: hearing intact CN IX, X: gag intact, uvula midline CN XI: sternocleidomastoid and trapezius muscles intact CN XII: tongue midline Bulk & Tone: normal, no fasciculations. Motor:  5/5 throughout  Sensation:  temperature and vibration sensation intact. . Deep Tendon Reflexes:  2+ throughout, toes downgoing.  Finger to nose testing:  Without dysmetria.   Heel to shin:  Without dysmetria.   Gait:  Normal station and stride.  Able to turn and tandem walk. Romberg negative.  IMPRESSION: 1.  Migraine without aura, without status migrainosus, not intractable 2.  Depression and anxiety 3.  Morbid obesity (BMI 52.52)  PLAN: 1.  For preventative management, Emgality every 30 days 2.  For abortive therapy, Ubrelvy.  She has failed 2 triptans (sumatriptan and rizatriptan), Tylenol and has contraindication to NSAIDs due to suspected allergy. 3.   Limit use of pain relievers to no more than 2 days out of week to prevent risk of rebound or medication-overuse headache. 4.  Keep headache diary 5.  Exercise, hydration, diet/weight loss, sleep hygiene, monitor for and avoid triggers 6.  Consider:  magnesium citrate 438m daily, riboflavin 402mdaily, and coenzyme Q10 10015mhree times daily 7. Always keep in mind that currently taking a hormone or birth control may be a possible trigger or aggravating factor for migraine. 8. Follow up 6 months  40 minutes spent face to face with patient, over 50% spent discussing management    Thank you for allowing me to take part in the care of this patient.  AdaMetta ClinesO  CC: Horald Pollen, MD

## 2019-08-02 ENCOUNTER — Encounter: Payer: Self-pay | Admitting: Neurology

## 2019-08-02 ENCOUNTER — Ambulatory Visit (INDEPENDENT_AMBULATORY_CARE_PROVIDER_SITE_OTHER): Payer: Medicaid Other | Admitting: Neurology

## 2019-08-02 ENCOUNTER — Other Ambulatory Visit: Payer: Self-pay

## 2019-08-02 ENCOUNTER — Encounter: Payer: Self-pay | Admitting: *Deleted

## 2019-08-02 VITALS — BP 120/77 | HR 89 | Ht 64.5 in | Wt 310.8 lb

## 2019-08-02 DIAGNOSIS — F418 Other specified anxiety disorders: Secondary | ICD-10-CM | POA: Diagnosis not present

## 2019-08-02 DIAGNOSIS — G43009 Migraine without aura, not intractable, without status migrainosus: Secondary | ICD-10-CM | POA: Diagnosis not present

## 2019-08-02 MED ORDER — UBRELVY 100 MG PO TABS: 1.0000 | ORAL_TABLET | ORAL | 3 refills | Status: DC | PRN

## 2019-08-02 NOTE — Progress Notes (Addendum)
Romie Jumper (Key: [id number]) Rx #: [id number] Ubrelvy 100MG tablets   Form [facility] Call-In Form  Plan Contact [phone] phone call interaction ID# [id number] Created 2 hours ago Sent to Cash the payer at [phone] to complete this PA. Spoke with Tu[place] Must call 24 hours later to get determination results PA# [id number]  I asked if need to fax clinicals she said no because it is initial request. PA was sent to pharmacist for review.  I called 08/03/2019 and it was denied call interaction ID [id number] spoke with [place] We will receive letter in the mail. I asked her to read it to me in the mean time. Does not meet clinical guidelines.  Must not use strong CYPA4 inhibitor. I asked for examples of these and she said the provider should know what these are.

## 2019-08-02 NOTE — Patient Instructions (Addendum)
1.  Continue Emgality 2.  Take Ubrelvy 130m.  May repeat in 2 hours if needed.  Maximum 2 tablets in 24 hours 3.  Limit use of pain relievers to no more than 2 days out of week to prevent risk of rebound or medication-overuse headache. 4.  Keep headache diary 5.  Follow up in 6 months (virtual visit is fine).

## 2019-08-13 ENCOUNTER — Telehealth: Payer: Self-pay

## 2019-08-13 NOTE — Telephone Encounter (Signed)
Patient called because she would like to know how to cut back on some of the medications she is taking. She told me that Dr. Neldon Mc had told her that if she did not have any reactions for about a month then she could let us know. Please advise on further instructions and thank you.

## 2019-08-16 NOTE — Telephone Encounter (Signed)
Patient informed and understood plan. Patient will call back with update.

## 2019-08-16 NOTE — Telephone Encounter (Signed)
Please inform patient that she can discontinue her famotidine, continue montelukast at 10 mg daily, and attempt to taper down her cetirizine to 10 mg daily.  Keep in contact with [place] noting her response to this approach.

## 2019-09-13 NOTE — Progress Notes (Signed)
Sent appeal via covermymeds fax#[phone] Today is last day to appeal

## 2019-10-08 ENCOUNTER — Other Ambulatory Visit: Payer: Self-pay | Admitting: *Deleted

## 2019-10-08 ENCOUNTER — Telehealth: Payer: Self-pay | Admitting: Allergy and Immunology

## 2019-10-08 MED ORDER — PREDNISONE 10 MG PO TABS: ORAL_TABLET | ORAL | 0 refills | Status: DC

## 2019-10-08 NOTE — Telephone Encounter (Signed)
Dreamer called in and stated she has been broken out in "welts" since yesterday.  She states she took Benadryl and when she woke up this morning they were back.  She would like to know what she needs to do.  Please advise.

## 2019-10-08 NOTE — Telephone Encounter (Signed)
Ok please ensure she continues this regimen.  For current flare up then can treat with prednisone course 90m twice a day x 3 days, 233mdaily x 2 days, 1047maily x 2 days and stop.

## 2019-10-08 NOTE — Telephone Encounter (Signed)
Kellie Fisher is taking the following:  Cetirizine 10 mg twice daily  Famotidine 40 mg twice daily  Montelukast 10 mg once daily   She is also adding in the occasional Benadryl, both oral and topical.

## 2019-10-08 NOTE — Telephone Encounter (Signed)
Is she still doing the following recommended by Dr. Neldon Mc?  If not would re-start this regimen at this time.                 A. Cetirizine 10 mg - 1-2 tablets 1-2 times per day (MAX = 40 mg)             B. Famotidine 40 mg - 1 tablet 2 times per day             C. montelukast 10 mg - 1 tablet 1 time per day

## 2019-10-08 NOTE — Telephone Encounter (Signed)
Informed and rx sent.

## 2019-10-19 ENCOUNTER — Institutional Professional Consult (permissible substitution): Payer: Medicaid Other | Admitting: Neurology

## 2019-10-19 ENCOUNTER — Encounter: Payer: Self-pay | Admitting: Neurology

## 2019-10-27 ENCOUNTER — Other Ambulatory Visit: Payer: Self-pay

## 2019-10-27 ENCOUNTER — Ambulatory Visit: Payer: Medicaid Other | Admitting: Neurology

## 2019-10-27 ENCOUNTER — Encounter: Payer: Self-pay | Admitting: Neurology

## 2019-10-27 VITALS — BP 127/75 | HR 85 | Temp 97.0°F | Ht 64.5 in | Wt 292.5 lb

## 2019-10-27 DIAGNOSIS — G4719 Other hypersomnia: Secondary | ICD-10-CM

## 2019-10-27 DIAGNOSIS — R0681 Apnea, not elsewhere classified: Secondary | ICD-10-CM

## 2019-10-27 DIAGNOSIS — R519 Headache, unspecified: Secondary | ICD-10-CM

## 2019-10-27 DIAGNOSIS — R0683 Snoring: Secondary | ICD-10-CM | POA: Diagnosis not present

## 2019-10-27 DIAGNOSIS — R351 Nocturia: Secondary | ICD-10-CM

## 2019-10-27 DIAGNOSIS — Z6841 Body Mass Index (BMI) 40.0 and over, adult: Secondary | ICD-10-CM

## 2019-10-27 NOTE — Patient Instructions (Signed)

## 2019-10-27 NOTE — Progress Notes (Signed)
Subjective:    Patient ID: Kellie Fisher is a 39 y.o. female.  HPI     Star Age, MD, PhD [facility] [address] [place], [place] [address]  Dear Dr. Darron Doom,  I saw your patient, Kellie Fisher, upon your kind request in my sleep clinic today for initial consultation of her sleep disorder, in particular, concern for underlying obstructive sleep apnea.  The patient is unaccompanied today.  Of note, she missed an appointment on 10/19/2019.  As you know, Kellie Fisher is a 39 year old right-handed woman with an underlying medical history of migraine headaches (followed by Dr. Tomi Likens at [facility] neurology), mood disorder, diabetes, allergic rhinitis, low back pain, NASH, and morbid obesity with a BMI of over 45, who reports snoring and excessive daytime somnolence.  I reviewed your office note from 06/28/2019.  Her Epworth sleepiness score is 12 out of 24.  Sleep is interrupted.  She does not have a very set schedule.  She lives with her 2 children, ages 38 and 81, both have autism.  The 43-year-old sleeps in the bed with her.  Her mother has noticed her snoring and her pauses in her breathing.  Patient reports being in bed around 9 but her sleep is not consolidated.  Rise time is typically before age.  She has nocturia about twice per average night and has woken up with a headache.  She is not aware of any family history of OSA.  Her stepfather has a CPAP machine and benefits from it.  She would be willing to consider CPAP therapy.  She is working on weight loss through [employer].  She has cut back on caffeine.  She drinks 1 energy drink in the morning typically now.  She drinks alcohol occasionally or rarely.  She is a non-smoker.  She does not work.  She is separated.  She has never had a sleep study.   Her Past Medical History Is Significant For: Past Medical History:  Diagnosis Date  . Ankle fracture   . Anxiety   . Daytime somnolence   .  Depression   . Diabetes mellitus without complication ([place])   . Eczema   . Migraine   . PTSD (post-traumatic stress disorder)   . Tumor containing calcium   . Urticaria     Her Past Surgical History Is Significant For: Past Surgical History:  Procedure Laterality Date  . CHOLECYSTECTOMY    . GANGLION CYST EXCISION Left   . HERNIA REPAIR      Her Family History Is Significant For: Family History  Problem Relation Age of Onset  . Mental illness Mother   . Other Mother        reflex sympathetic dystrophy  . Alcohol abuse Father   . Autism Son        x2    Her Social History Is Significant For: Social History   Socioeconomic History  . Marital status: Married    Spouse name: Not on file  . Number of children: 2  . Years of education: college  . Highest education level: Associate degree: occupational, Hotel manager, or vocational program  Occupational History  . Occupation: stay at home mother  Tobacco Use  . Smoking status: Never Smoker  . Smokeless tobacco: Never Used  Substance and Sexual Activity  . Alcohol use: No    Comment: occasional  . Drug use: No  . Sexual activity: Yes    Birth control/protection: I.U.D.  Other Topics Concern  .  Not on file  Social History Narrative   Pt lives with children married   Right handed   She has 2 children   Drinks coffee , tea and soda a lot   Social Determinants of Health   Financial Resource Strain:   . Difficulty of Paying Living Expenses: Not on file  Food Insecurity:   . Worried About Charity fundraiser in the Last Year: Not on file  . Ran Out of Food in the Last Year: Not on file  Transportation Needs:   . Lack of Transportation (Medical): Not on file  . Lack of Transportation (Non-Medical): Not on file  Physical Activity:   . Days of Exercise per Week: Not on file  . Minutes of Exercise per Session: Not on file  Stress:   . Feeling of Stress : Not on file  Social Connections:   . Frequency of Communication  with Friends and Family: Not on file  . Frequency of Social Gatherings with Friends and Family: Not on file  . Attends Religious Services: Not on file  . Active Member of Clubs or Organizations: Not on file  . Attends Archivist Meetings: Not on file  . Marital Status: Not on file    Her Allergies Are:  Allergies  Allergen Reactions  . Azithromycin Other (See Comments)    Makes her crazy   . Benzoyl Peroxide   :   Her Current Medications Are:  Outpatient Encounter Medications as of 10/27/2019  Medication Sig  . acetaminophen (TYLENOL) 500 MG tablet Take 1,000 mg by mouth every 6 (six) hours as needed. For pain.  Marland Kitchen albuterol (PROVENTIL HFA;VENTOLIN HFA) 108 (90 Base) MCG/ACT inhaler Inhale 1-2 puffs into the lungs every 6 (six) hours as needed for wheezing or shortness of breath. (Patient not taking: Reported on 10/27/2019)  . buPROPion (WELLBUTRIN XL) 300 MG 24 hr tablet TAKE 1 TABLET BY MOUTH EVERY DAY IN THE MORNING  . BYDUREON 2 MG PEN Inject 2 mg into the skin once a week.  . diphenhydrAMINE (BENADRYL) 25 MG tablet Take 1 tablet (25 mg total) by mouth every 6 (six) hours. (Patient not taking: Reported on 10/27/2019)  . EMGALITY 120 MG/ML SOAJ Inject 120 mg into the skin every 30 (thirty) days.  Marland Kitchen EPINEPHrine (EPIPEN 2-PAK) 0.3 mg/0.3 mL IJ SOAJ injection Inject 0.3 mLs (0.3 mg total) into the muscle as needed for anaphylaxis. (Patient not taking: Reported on 10/27/2019)  . escitalopram (LEXAPRO) 20 MG tablet Take 20 mg by mouth daily.  Marland Kitchen etonogestrel-ethinyl estradiol (NUVARING) 0.12-0.015 MG/24HR vaginal ring Place 1 each vaginally every 28 (twenty-eight) days. Insert vaginally and leave in place for 3 consecutive weeks, then remove for 1 week.  . famotidine (PEPCID) 40 MG tablet Take 1 tablet (40 mg total) by mouth 2 (two) times daily. (Patient not taking: Reported on 10/27/2019)  . fluticasone (FLONASE) 50 MCG/ACT nasal spray SHAKE LQ AND U 2 SPRAYS IEN D  . lamoTRIgine  (LAMICTAL) 150 MG tablet Take 150 mg by mouth at bedtime.  . Prenatal Vit-Fe Fumarate-FA (PRENATAL VITAMIN PLUS LOW IRON) 27-1 MG TABS Take 1 tablet by mouth daily.  . rizatriptan (MAXALT-MLT) 10 MG disintegrating tablet TAKE 1 TABLET BY MOUTH AT ONSET OF MIGRAINE, MAY REPEAT IN 2 HOURS  . Ubrogepant (UBRELVY) 100 MG TABS Take 1 tablet by mouth as needed (May repeat dose after 2 hours.  Maximum 2 tablets in 24 hours.). (Patient not taking: Reported on 10/27/2019)  . [DISCONTINUED] predniSONE (DELTASONE)  10 MG tablet Take 2 tablets twice daily for 3 days, then 2 tablets once daily for 2 days, then 1 tablet once daily for 2 days.   No facility-administered encounter medications on file as of 10/27/2019.  :  Review of Systems:  Out of a complete 14 point review of systems, all are reviewed and negative with the exception of these symptoms as listed below: Review of Systems  Neurological:       Pt in room 2 alone. Reports waking frequently throughout the night. She struggles with daytime somnolence. She has two autistic children (ages 51 & 2). Her 83 year old sleeps in the bed with her and gets up several times.  Epworth Sleepiness Scale 0= would never doze 1= slight chance of dozing 2= moderate chance of dozing 3= high chance of dozing  Sitting and reading: 3 Watching TV: 2 Sitting inactive in a public place (ex. Theater or meeting): 1 As a passenger in a car for an hour without a break: 3 Lying down to rest in the afternoon: 3 Sitting and talking to someone: 0 Sitting quietly after lunch (no alcohol): 0 In a car, while stopped in traffic: 0 Total: 12    Objective:  Neurological Exam  Physical Exam Physical Examination:   Vitals:   10/27/19 0855  BP: 127/75  Pulse: 85  Temp: (!) 97 F (36.1 C)    General Examination: The patient is a very pleasant 39 y.o. female in no acute distress. She appears well-developed and well-nourished and well groomed.   HEENT: Normocephalic,  atraumatic, pupils are equal, round and reactive to light, extraocular tracking is good without limitation to gaze excursion or nystagmus noted. Corrective eyeglasses in place. Hearing is grossly intact. Face is symmetric with normal facial animation. Speech is clear with no dysarthria noted. There is no hypophonia. There is no lip, neck/head, jaw or voice tremor. Neck is supple with full range of passive and active motion. There are no carotid bruits on auscultation. Oropharynx exam reveals: mild mouth dryness, adequate dental hygiene and moderate airway crowding, due to Small airway entry, longer uvula, tonsillar size of 1-2+.  Mallampati is class II.  Neck circumference is 16-3/8 inches.  Tongue protrudes centrally in palate elevates symmetrically.  Chest: Clear to auscultation without wheezing, rhonchi or crackles noted.  Heart: S1+S2+0, regular and normal without murmurs, rubs or gallops noted.   Abdomen: Soft, non-tender and non-distended with normal bowel sounds appreciated on auscultation.  Extremities: There is no pitting edema in the distal lower extremities bilaterally.   Skin: Warm and dry without trophic changes noted.   Musculoskeletal: exam reveals no obvious joint deformities, tenderness or joint swelling or erythema.   Neurologically:  Mental status: The patient is awake, alert and oriented in all 4 spheres. Her immediate and remote memory, attention, language skills and fund of knowledge are appropriate. There is no evidence of aphasia, agnosia, apraxia or anomia. Speech is clear with normal prosody and enunciation. Thought process is linear. Mood is normal and affect is normal.  Cranial nerves II - XII are as described above under HEENT exam.  Motor exam: Normal bulk, strength and tone is noted. There is no tremor, Romberg is negative. Fine motor skills and coordination: grossly intact.  Cerebellar testing: No dysmetria or intention tremor. There is no truncal or gait ataxia.   Sensory exam: intact to light touch in the upper and lower extremities.  Gait, station and balance: She stands easily. No veering to one side is  noted. No leaning to one side is noted. Posture is age-appropriate and stance is narrow based. Gait shows normal stride length and normal pace. No problems turning are noted. Tandem walk is unremarkable.                Assessment and Plan:  In summary, ARIELL GUNNELS is a very pleasant 39 y.o.-year old female with an underlying medical history of migraine headaches (followed by Dr. Tomi Likens at [facility] neurology), mood disorder, diabetes, allergic rhinitis, low back pain, NASH, and morbid obesity with a BMI of over 35, whose history and physical exam are concerning for obstructive sleep apnea (OSA). I had a long chat with the patient about my findings and the diagnosis of OSA, its prognosis and treatment options. We talked about medical treatments, surgical interventions and non-pharmacological approaches. I explained in particular the risks and ramifications of untreated moderate to severe OSA, especially with respect to developing cardiovascular disease down the Road, including congestive heart failure, difficult to treat hypertension, cardiac arrhythmias, or stroke. Even type 2 diabetes has, in part, been linked to untreated OSA. Symptoms of untreated OSA include daytime sleepiness, memory problems, mood irritability and mood disorder such as depression and anxiety, lack of energy, as well as recurrent headaches, especially morning headaches. We talked about trying to maintain a healthy lifestyle in general, as well as the importance of weight control. We also talked about the importance of good sleep hygiene. I recommended the following at this time: sleep study.  I explained the sleep test procedure to the patient and also outlined possible surgical and non-surgical treatment options of OSA, including the use of a custom-made dental device (which would require  a referral to a specialist dentist or oral surgeon), upper airway surgical options, such as traditional UPPP or a novel less invasive surgical option in the form of Inspire hypoglossal nerve stimulation (which would involve a referral to an ENT surgeon). I also explained the CPAP treatment option to the patient, who indicated that she would be willing to try CPAP if the need arises. I explained the importance of being compliant with PAP treatment, not only for insurance purposes but primarily to improve Her symptoms, and for the patient's long term health benefit, including to reduce Her cardiovascular risks. I answered all her questions today and the patient was in agreement. I plan to see her back after the sleep study is completed and encouraged her to call with any interim questions, concerns, problems or updates.   Thank you very much for allowing me to participate in the care of this nice patient. If I can be of any further assistance to you please do not hesitate to call me at [phone].  Sincerely,   Star Age, MD, PhD

## 2019-11-21 ENCOUNTER — Ambulatory Visit (INDEPENDENT_AMBULATORY_CARE_PROVIDER_SITE_OTHER): Payer: Medicaid Other | Admitting: Neurology

## 2019-11-21 DIAGNOSIS — R0683 Snoring: Secondary | ICD-10-CM

## 2019-11-21 DIAGNOSIS — G4719 Other hypersomnia: Secondary | ICD-10-CM

## 2019-11-21 DIAGNOSIS — Z6841 Body Mass Index (BMI) 40.0 and over, adult: Secondary | ICD-10-CM

## 2019-11-21 DIAGNOSIS — G4720 Circadian rhythm sleep disorder, unspecified type: Secondary | ICD-10-CM

## 2019-11-21 DIAGNOSIS — G4733 Obstructive sleep apnea (adult) (pediatric): Secondary | ICD-10-CM

## 2019-11-21 DIAGNOSIS — R519 Headache, unspecified: Secondary | ICD-10-CM

## 2019-11-21 DIAGNOSIS — R0681 Apnea, not elsewhere classified: Secondary | ICD-10-CM

## 2019-11-21 DIAGNOSIS — G4761 Periodic limb movement disorder: Secondary | ICD-10-CM

## 2019-11-21 DIAGNOSIS — R351 Nocturia: Secondary | ICD-10-CM

## 2019-11-30 ENCOUNTER — Telehealth: Payer: Self-pay

## 2019-11-30 NOTE — Telephone Encounter (Signed)
-----   Message from Star Age, MD sent at 11/30/2019  8:24 AM EST ----- Patient referred by Dr. Darron Doom, seen by me on 10/27/19, diagnostic PSG on 11/21/19.    Please call and notify the patient that the recent sleep study showed obstructive sleep apnea. OSA is overall mild, but in the moderate range during supine sleep. Given her Hx of recurrent migraines and EDS, OSA is worth treating to see if she feels better after treatment. To that end I recommend treatment for this in the form of autoPAP, which means, that we don't have to bring her back for a second sleep study with CPAP, but will let him try an autoPAP machine at home, through a DME company (of her choice, or as per insurance requirement). The DME representative will educate her on how to use the machine, how to put the mask on, etc. I have placed an order in the chart. Please send referral, talk to patient, send report to referring MD. We will need a FU in sleep clinic for 10 weeks post-PAP set up, please arrange that with me or one of our NPs. Thanks,   Star Age, MD, PhD [facility])

## 2019-11-30 NOTE — Procedures (Signed)
PATIENT'S NAME:  Kellie Fisher, Kellie Fisher DOB:      1981/03/21      MR#:    [id number]     DATE OF RECORDING: 11/21/2019 REFERRING M.D.:  Horald Pollen, MD Study Performed:   Baseline Polysomnogram HISTORY: 39 year old woman with a history of migraine headaches (followed by Dr. Tomi Likens at [facility] neurology), mood disorder, diabetes, allergic rhinitis, low back pain, NASH, and morbid obesity with a BMI of over 45, who reports snoring and excessive daytime somnolence. The patient endorsed the Epworth Sleepiness Scale at 12/24 points. The patient's weight 292 pounds with a height of 64 (inches), resulting in a BMI of 49.1 kg/m2. The patient's neck circumference measured 16.4 inches.  CURRENT MEDICATIONS: Tylenol, Proventil, Wellbutrin XL, Benadryl, Lexapro, Nuvaring, Pepcid, Flonase, Lamictal, Maxalt, Ubrelvy.   PROCEDURE:  This is a multichannel digital polysomnogram utilizing the Somnostar 11.2 system.  Electrodes and sensors were applied and monitored per AASM Specifications.   EEG, EOG, Chin and Limb EMG, were sampled at 200 Hz.  ECG, Snore and Nasal Pressure, Thermal Airflow, Respiratory Effort, CPAP Flow and Pressure, Oximetry was sampled at 50 Hz. Digital video and audio were recorded.      BASELINE STUDY  Lights Out was at 22:15 and Lights On at 05:15.  Total recording time (TRT) was 420 minutes, with a total sleep time (TST) of 333.5 minutes.   The patient's sleep latency was 80.5 minutes, which is delayed. REM latency was 302 minutes, which is delayed. The sleep efficiency was 79.4%, which is reduced.     SLEEP ARCHITECTURE: WASO (Wake after sleep onset) was 14.5 minutes.  There were 6 minutes in Stage N1, 228 minutes Stage N2, 61 minutes Stage N3 and 38.5 minutes in Stage REM.  The percentage of Stage N1 was 1.8%, Stage N2 was 68.4%, which is increased, Stage N3 was 18.3%, which is normal, and Stage R (REM sleep) was 11.5%, which is reduced. The arousals were noted as: 37 were spontaneous, 15 were  associated with PLMs, 15 were associated with respiratory events.  RESPIRATORY ANALYSIS:  There were a total of 60 respiratory events:  2 obstructive apneas, 0 central apneas and 0 mixed apneas with a total of 2 apneas and an apnea index (AI) of .4 /hour. There were 58 hypopneas with a hypopnea index of 10.4 /hour. The patient also had 0 respiratory event related arousals (RERAs).      The total APNEA/HYPOPNEA INDEX (AHI) was 10.8/hour and the total RESPIRATORY DISTURBANCE INDEX was  10.8 /hour.  1 events occurred in REM sleep and 114 events in NREM. The REM AHI was  1.6 /hour, versus a non-REM AHI of 12. The patient spent 114 minutes of total sleep time in the supine position and 220 minutes in non-supine.. The supine AHI was 16.3 versus a non-supine AHI of 8.0.  OXYGEN SATURATION & C02:  The Wake baseline 02 saturation was 96%, with the lowest being 86%. Time spent below 89% saturation equaled 3 minutes.  PERIODIC LIMB MOVEMENTS: The patient had a total of 309 Periodic Limb Movements.  The Periodic Limb Movement (PLM) index was 55.6 and the PLM Arousal index was 2.7/hour.  Audio and video analysis did not show any abnormal or unusual movements, behaviors, phonations or vocalizations. The patient took 1 bathroom break. Mild snoring was noted. The EKG was in keeping with normal sinus rhythm (NSR). Post-study, the patient indicated that sleep was better than usual.   IMPRESSION:  1. Obstructive Sleep Apnea (OSA) 2. Periodic Limb  Movement Disorder (PLMD) 3. Dysfunctions associated with sleep stages or arousal from sleep  RECOMMENDATIONS:  1. This study demonstrates overall mild obstructive sleep apnea, moderate in the supine position, with a total AHI of 10.8/hour, supine AHI of 16.3/hour and O2 nadir of 86%. Given the patient's medical history and sleep related complaints, treatment with positive airway pressure is recommended; this can be achieved in the form of autoPAP. Alternatively, a  full-night CPAP titration study would allow optimization of therapy if needed. Other treatment options may include avoidance of supine sleep position along with weight loss, upper airway or jaw surgery in selected patients or the use of an oral appliance in certain patients. ENT evaluation and/or consultation with a maxillofacial surgeon or dentist may be feasible in some instances. Please note that untreated obstructive sleep apnea may carries additional perioperative morbidity. Patients with significant obstructive sleep apnea should receive perioperative PAP therapy and the surgeons and particularly the anesthesiologist should be informed of the diagnosis and the severity of the sleep disordered breathing. 2. Severe PLMs (periodic limb movements of sleep) were noted during this study with no significant arousals; clinical correlation is recommended. Medication effect from the antidepressant medication should be considered. PLMs may improve with PAP therapy. 3. This study shows sleep fragmentation and abnormal sleep stage percentages; these are nonspecific findings and per se do not signify an intrinsic sleep disorder or a cause for the patient's sleep-related symptoms. Causes include (but are not limited to) the first night effect of the sleep study, circadian rhythm disturbances, medication effect or an underlying mood disorder or medical problem.  4. The patient should be cautioned not to drive, work at heights, or operate dangerous or heavy equipment when tired or sleepy. Review and reiteration of good sleep hygiene measures should be pursued with any patient. 5. The patient will be seen in follow-up by Dr. Rexene Alberts at [facility] for discussion of the test results and further management strategies. The referring provider will be notified of the test results.  I certify that I have reviewed the entire raw data recording prior to the issuance of this report in accordance with the Standards of Accreditation of the  American Academy of Sleep Medicine (AASM)  Star Age, MD, PhD Diplomat, American Board of Neurology and Sleep Medicine (Neurology and Sleep Medicine)

## 2019-11-30 NOTE — Progress Notes (Signed)
Patient referred by Dr. Darron Doom, seen by me on 10/27/19, diagnostic PSG on 11/21/19.    Please call and notify the patient that the recent sleep study showed obstructive sleep apnea. OSA is overall mild, but in the moderate range during supine sleep. Given her Hx of recurrent migraines and EDS, OSA is worth treating to see if she feels better after treatment. To that end I recommend treatment for this in the form of autoPAP, which means, that we don't have to bring her back for a second sleep study with CPAP, but will let him try an autoPAP machine at home, through a DME company (of her choice, or as per insurance requirement). The DME representative will educate her on how to use the machine, how to put the mask on, etc. I have placed an order in the chart. Please send referral, talk to patient, send report to referring MD. We will need a FU in sleep clinic for 10 weeks post-PAP set up, please arrange that with me or one of our NPs. Thanks,   Star Age, MD, PhD [facility])

## 2019-11-30 NOTE — Telephone Encounter (Signed)
Lvm asking pt to call back so we could review sleep study results.

## 2019-11-30 NOTE — Addendum Note (Signed)
Addended by: Star Age on: 11/30/2019 08:24 AM   Modules accepted: Orders

## 2019-12-01 NOTE — Telephone Encounter (Signed)
I contacted the pt again and we reviewed sleep study. She is agreeable to auto pap therapy and will use [employer] as DME.  Pt's order has been sent and f/u has been made. Pt understands insurance compliance guidelines.

## 2019-12-04 ENCOUNTER — Other Ambulatory Visit: Payer: Self-pay | Admitting: Allergy and Immunology

## 2020-02-03 NOTE — Progress Notes (Signed)
Called patient who did not respond to phone call.  Sent text message with link for virtual visit.  Patient did not respond.

## 2020-02-07 ENCOUNTER — Other Ambulatory Visit: Payer: Self-pay

## 2020-02-07 ENCOUNTER — Telehealth (INDEPENDENT_AMBULATORY_CARE_PROVIDER_SITE_OTHER): Payer: Medicaid Other | Admitting: Neurology

## 2020-02-07 DIAGNOSIS — G43009 Migraine without aura, not intractable, without status migrainosus: Secondary | ICD-10-CM

## 2020-02-14 ENCOUNTER — Ambulatory Visit: Payer: Self-pay | Admitting: Neurology

## 2020-04-13 ENCOUNTER — Other Ambulatory Visit: Payer: Self-pay | Admitting: Allergy and Immunology

## 2020-04-14 ENCOUNTER — Ambulatory Visit (INDEPENDENT_AMBULATORY_CARE_PROVIDER_SITE_OTHER): Payer: Medicaid Other

## 2020-04-14 ENCOUNTER — Other Ambulatory Visit: Payer: Self-pay

## 2020-04-14 ENCOUNTER — Ambulatory Visit (HOSPITAL_COMMUNITY)
Admission: EM | Admit: 2020-04-14 | Discharge: 2020-04-14 | Disposition: A | Discharge status: Home / Self Care | Payer: Medicaid Other | Attending: Family Medicine | Admitting: Family Medicine

## 2020-04-14 ENCOUNTER — Encounter (HOSPITAL_COMMUNITY): Payer: Self-pay

## 2020-04-14 DIAGNOSIS — R0781 Pleurodynia: Secondary | ICD-10-CM | POA: Diagnosis not present

## 2020-04-14 NOTE — Discharge Instructions (Addendum)
Your x ray did not show any fracture Most likely bad bruise.  Tylenol for pain as needed Rest  Follow up as needed for continued or worsening symptoms

## 2020-04-14 NOTE — ED Triage Notes (Signed)
Patient reports she was cleaning a bathtub when she lost her grip and slipped. Her right ribcage landed on the rim of the bathtub. Patient reports 4/10 pain at rest but 8/10 pain with any sort of movement.

## 2020-04-14 NOTE — ED Provider Notes (Signed)
[place]    CSN: [id number] Arrival date & time: 04/14/20  0801      History   Chief Complaint Chief Complaint  Patient presents with  . Rib Pain    HPI Kellie Fisher is a 39 y.o. female.   Patient is a 39 year old female that presents today with right rib pain.  This started after falling and hitting the right rib cage on the bathtub yesterday.  Pain is worse with movement and deep breathing.  Denies any trouble breathing.  No noticeable bruising.  No cracking or popping.     Past Medical History:  Diagnosis Date  . Ankle fracture   . Anxiety   . Daytime somnolence   . Depression   . Diabetes mellitus without complication ([place])   . Eczema   . Migraine   . PTSD (post-traumatic stress disorder)   . Tumor containing calcium   . Urticaria     Patient Active Problem List   Diagnosis Date Noted  . Cellulitis of oral soft tissues 04/13/2017  . Depression with anxiety 04/13/2017    Past Surgical History:  Procedure Laterality Date  . CHOLECYSTECTOMY    . GANGLION CYST EXCISION Left   . HERNIA REPAIR      OB History   No obstetric history on file.      Home Medications    Prior to Admission medications   Medication Sig Start Date End Date Taking? Authorizing Provider  acetaminophen (TYLENOL) 500 MG tablet Take 1,000 mg by mouth every 6 (six) hours as needed. For pain.    [provider]  buPROPion (WELLBUTRIN XL) 300 MG 24 hr tablet TAKE 1 TABLET BY MOUTH EVERY DAY IN THE MORNING 03/06/19   [provider]  BYDUREON 2 MG PEN Inject 2 mg into the skin once a week. 02/28/17   [provider]  EMGALITY 120 MG/ML SOAJ Inject 120 mg into the skin every 30 (thirty) days. 04/01/18   [provider]  escitalopram (LEXAPRO) 20 MG tablet Take 20 mg by mouth daily. 03/30/17   [provider]  etonogestrel-ethinyl estradiol (NUVARING) 0.12-0.015 MG/24HR vaginal ring Place 1 each vaginally every 28 (twenty-eight)  days. Insert vaginally and leave in place for 3 consecutive weeks, then remove for 1 week.    [provider]  famotidine (PEPCID) 40 MG tablet TAKE 1 TABLET BY MOUTH TWICE A DAY 12/06/19   Kozlow, Donnamarie Poag, MD  fluticasone (FLONASE) 50 MCG/ACT nasal spray SHAKE LQ AND U 2 SPRAYS IEN D 11/24/18   [provider]  lamoTRIgine (LAMICTAL) 150 MG tablet Take 150 mg by mouth at bedtime. 04/16/18   [provider]  Prenatal Vit-Fe Fumarate-FA (PRENATAL VITAMIN PLUS LOW IRON) 27-1 MG TABS Take 1 tablet by mouth daily. 06/04/19   [provider]  rizatriptan (MAXALT-MLT) 10 MG disintegrating tablet TAKE 1 TABLET BY MOUTH AT ONSET OF MIGRAINE, MAY REPEAT IN 2 HOURS 03/12/19   [provider]  albuterol (PROVENTIL HFA;VENTOLIN HFA) 108 (90 Base) MCG/ACT inhaler Inhale 1-2 puffs into the lungs every 6 (six) hours as needed for wheezing or shortness of breath. Patient not taking: Reported on 10/27/2019 10/17/17 04/14/20  Zigmund Gottron, NP  diphenhydrAMINE (BENADRYL) 25 MG tablet Take 1 tablet (25 mg total) by mouth every 6 (six) hours. Patient not taking: Reported on 10/27/2019 04/17/19 04/14/20  Dorie Rank, MD    Family History Family History  Problem Relation Age of Onset  . Mental illness Mother   .  Other Mother        reflex sympathetic dystrophy  . Alcohol abuse Father   . Autism Son        x2    Social History Social History   Tobacco Use  . Smoking status: Never Smoker  . Smokeless tobacco: Never Used  Vaping Use  . Vaping Use: Never used  Substance Use Topics  . Alcohol use: No    Comment: occasional  . Drug use: No     Allergies   Azithromycin, Nsaids, and Benzoyl peroxide   Review of Systems Review of Systems   Physical Exam Triage Vital Signs ED Triage Vitals  Enc Vitals Group     BP 04/14/20 0822 (!) 138/85     Pulse Rate 04/14/20 0822 103     Resp 04/14/20 0822 14     Temp 04/14/20 0822 99.3 F (37.4 C)     Temp src --      SpO2  04/14/20 0822 96 %     Weight --      Height --      Head Circumference --      Peak Flow --      Pain Score 04/14/20 0819 4     Pain Loc --      Pain Edu? --      Excl. in [place]? --    No data found.  Updated Vital Signs BP (!) 138/85   Pulse 103   Temp 99.3 F (37.4 C)   Resp 14   SpO2 96%   Visual Acuity Right Eye Distance:   Left Eye Distance:   Bilateral Distance:    Right Eye Near:   Left Eye Near:    Bilateral Near:     Physical Exam Vitals and nursing note reviewed.  Constitutional:      General: She is not in acute distress.    Appearance: Normal appearance. She is not ill-appearing, toxic-appearing or diaphoretic.  HENT:     Head: Normocephalic.     Nose: Nose normal.  Eyes:     Conjunctiva/sclera: Conjunctivae normal.  Pulmonary:     Effort: Pulmonary effort is normal.  Chest:       Comments: TTP without bruising, swelling, deformity.  Musculoskeletal:        General: Normal range of motion.     Cervical back: Normal range of motion.  Skin:    General: Skin is warm and dry.     Findings: No rash.  Neurological:     Mental Status: She is alert.  Psychiatric:        Mood and Affect: Mood normal.      UC Treatments / Results  Labs (all labs ordered are listed, but only abnormal results are displayed) Labs Reviewed - No data to display  EKG   Radiology DG Ribs Unilateral W/Chest Right  Result Date: 04/14/2020 CLINICAL DATA:  RIGHT anterior lower rib pain for 3 days after a fall, constant pain increased with breathing movement EXAM: RIGHT RIBS AND CHEST - 3+ VIEW COMPARISON:  Chest radiographs since 10/19/2017 FINDINGS: Normal heart size, mediastinal contours, and pulmonary vascularity. Lungs clear. No infiltrate, pleural effusion, or pneumothorax. BB placed at site of symptoms at lower RIGHT ribs laterally. No rib fracture or bone destruction. IMPRESSION: No acute abnormalities. Electronically Signed   By: Lavonia Dana M.D.   On: 04/14/2020  08:57    Procedures Procedures (including critical care time)  Medications Ordered in UC Medications - No data to  display  Initial Impression / Assessment and Plan / UC Course  I have reviewed the triage vital signs and the nursing notes.  Pertinent labs & imaging results that were available during my care of the patient were reviewed by me and considered in my medical decision making (see chart for details).     Rib pain X-ray without any fractures.  Most likely bad bruise. She can take Tylenol for pain as needed.  Rest and ice to the area Final Clinical Impressions(s) / UC Diagnoses   Final diagnoses:  Rib pain on right side     Discharge Instructions     Your x ray did not show any fracture Most likely bad bruise.  Tylenol for pain as needed Rest  Follow up as needed for continued or worsening symptoms       ED Prescriptions    None     PDMP not reviewed this encounter.   Orvan July, NP 04/14/20 1255

## 2020-06-02 ENCOUNTER — Ambulatory Visit (HOSPITAL_COMMUNITY)
Admission: RE | Admit: 2020-06-02 | Discharge: 2020-06-02 | Disposition: A | Discharge status: Home / Self Care | Payer: Medicaid Other | Source: Ambulatory Visit | Attending: Family Medicine | Admitting: Family Medicine

## 2020-06-02 ENCOUNTER — Other Ambulatory Visit: Payer: Self-pay | Admitting: Family

## 2020-06-02 ENCOUNTER — Other Ambulatory Visit (HOSPITAL_COMMUNITY): Payer: Self-pay | Admitting: Nurse Practitioner

## 2020-06-02 DIAGNOSIS — E1169 Type 2 diabetes mellitus with other specified complication: Secondary | ICD-10-CM | POA: Diagnosis present

## 2020-06-02 DIAGNOSIS — U071 COVID-19: Secondary | ICD-10-CM

## 2020-06-02 MED ORDER — DIPHENHYDRAMINE HCL 50 MG/ML IJ SOLN: 50.0000 mg | Freq: Once | INTRAMUSCULAR | Status: DC | PRN

## 2020-06-02 MED ORDER — FAMOTIDINE IN NACL 20-0.9 MG/50ML-% IV SOLN: 20.0000 mg | Freq: Once | INTRAVENOUS | Status: DC | PRN

## 2020-06-02 MED ORDER — ALBUTEROL SULFATE HFA 108 (90 BASE) MCG/ACT IN AERS: 2.0000 | INHALATION_SPRAY | Freq: Once | RESPIRATORY_TRACT | Status: DC | PRN

## 2020-06-02 MED ORDER — SODIUM CHLORIDE 0.9 % IV SOLN: INTRAVENOUS | Status: DC | PRN

## 2020-06-02 MED ORDER — EPINEPHRINE 0.3 MG/0.3ML IJ SOAJ: 0.3000 mg | Freq: Once | INTRAMUSCULAR | Status: DC | PRN

## 2020-06-02 MED ORDER — METHYLPREDNISOLONE SODIUM SUCC 125 MG IJ SOLR: 125.0000 mg | Freq: Once | INTRAMUSCULAR | Status: DC | PRN

## 2020-06-02 MED ORDER — SODIUM CHLORIDE 0.9 % IV SOLN
1200.0000 mg | Freq: Once | INTRAVENOUS | Status: AC
  Administered 2020-06-02: 1200 mg via INTRAVENOUS
  Filled 2020-06-02: qty 10

## 2020-06-02 NOTE — Discharge Instructions (Signed)
COVID-19 COVID-19 is a respiratory infection that is caused by a virus called severe acute respiratory syndrome coronavirus 2 (SARS-CoV-2). The disease is also known as coronavirus disease or novel coronavirus. In some people, the virus may not cause any symptoms. In others, it may cause a serious infection. The infection can get worse quickly and can lead to complications, such as:  Pneumonia, or infection of the lungs.  Acute respiratory distress syndrome or ARDS. This is a condition in which fluid build-up in the lungs prevents the lungs from filling with air and passing oxygen into the blood.  Acute respiratory failure. This is a condition in which there is not enough oxygen passing from the lungs to the body or when carbon dioxide is not passing from the lungs out of the body.  Sepsis or septic shock. This is a serious bodily reaction to an infection.  Blood clotting problems.  Secondary infections due to bacteria or fungus.  Organ failure. This is when your body's organs stop working. The virus that causes COVID-19 is contagious. This means that it can spread from person to person through droplets from coughs and sneezes (respiratory secretions). What are the causes? This illness is caused by a virus. You may catch the virus by:  Breathing in droplets from an infected person. Droplets can be spread by a person breathing, speaking, singing, coughing, or sneezing.  Touching something, like a table or a doorknob, that was exposed to the virus (contaminated) and then touching your mouth, nose, or eyes. What increases the risk? Risk for infection You are more likely to be infected with this virus if you:  Are within 6 feet (2 meters) of a person with COVID-19.  Provide care for or live with a person who is infected with COVID-19.  Spend time in crowded indoor spaces or live in shared housing. Risk for serious illness You are more likely to become seriously ill from the virus if  you:  Are 50 years of age or older. The higher your age, the more you are at risk for serious illness.  Live in a nursing home or long-term care facility.  Have cancer.  Have a long-term (chronic) disease such as: ? Chronic lung disease, including chronic obstructive pulmonary disease or asthma. ? A long-term disease that lowers your body's ability to fight infection (immunocompromised). ? Heart disease, including heart failure, a condition in which the arteries that lead to the heart become narrow or blocked (coronary artery disease), a disease which makes the heart muscle thick, weak, or stiff (cardiomyopathy). ? Diabetes. ? Chronic kidney disease. ? Sickle cell disease, a condition in which red blood cells have an abnormal "sickle" shape. ? Liver disease.  Are obese. What are the signs or symptoms? Symptoms of this condition can range from mild to severe. Symptoms may appear any time from 2 to 14 days after being exposed to the virus. They include:  A fever or chills.  A cough.  Difficulty breathing.  Headaches, body aches, or muscle aches.  Runny or stuffy (congested) nose.  A sore throat.  New loss of taste or smell. Some people may also have stomach problems, such as nausea, vomiting, or diarrhea. Other people may not have any symptoms of COVID-19. How is this diagnosed? This condition may be diagnosed based on:  Your signs and symptoms, especially if: ? You live in an area with a COVID-19 outbreak. ? You recently traveled to or from an area where the virus is common. ? You   provide care for or live with a person who was diagnosed with COVID-19. ? You were exposed to a person who was diagnosed with COVID-19.  A physical exam.  Lab tests, which may include: ? Taking a sample of fluid from the back of your nose and throat (nasopharyngeal fluid), your nose, or your throat using a swab. ? A sample of mucus from your lungs (sputum). ? Blood tests.  Imaging tests,  which may include, X-rays, CT scan, or ultrasound. How is this treated? At present, there is no medicine to treat COVID-19. Medicines that treat other diseases are being used on a trial basis to see if they are effective against COVID-19. Your health care provider will talk with you about ways to treat your symptoms. For most people, the infection is mild and can be managed at home with rest, fluids, and over-the-counter medicines. Treatment for a serious infection usually takes places in a hospital intensive care unit (ICU). It may include one or more of the following treatments. These treatments are given until your symptoms improve.  Receiving fluids and medicines through an IV.  Supplemental oxygen. Extra oxygen is given through a tube in the nose, a face mask, or a hood.  Positioning you to lie on your stomach (prone position). This makes it easier for oxygen to get into the lungs.  Continuous positive airway pressure (CPAP) or bi-level positive airway pressure (BPAP) machine. This treatment uses mild air pressure to keep the airways open. A tube that is connected to a motor delivers oxygen to the body.  Ventilator. This treatment moves air into and out of the lungs by using a tube that is placed in your windpipe.  Tracheostomy. This is a procedure to create a hole in the neck so that a breathing tube can be inserted.  Extracorporeal membrane oxygenation (ECMO). This procedure gives the lungs a chance to recover by taking over the functions of the heart and lungs. It supplies oxygen to the body and removes carbon dioxide. Follow these instructions at home: Lifestyle  If you are sick, stay home except to get medical care. Your health care provider will tell you how long to stay home. Call your health care provider before you go for medical care.  Rest at home as told by your health care provider.  Do not use any products that contain nicotine or tobacco, such as cigarettes,  e-cigarettes, and chewing tobacco. If you need help quitting, ask your health care provider.  Return to your normal activities as told by your health care provider. Ask your health care provider what activities are safe for you. General instructions  Take over-the-counter and prescription medicines only as told by your health care provider.  Drink enough fluid to keep your urine pale yellow.  Keep all follow-up visits as told by your health care provider. This is important. How is this prevented?  There is no vaccine to help prevent COVID-19 infection. However, there are steps you can take to protect yourself and others from this virus. To protect yourself:   Do not travel to areas where COVID-19 is a risk. The areas where COVID-19 is reported change often. To identify high-risk areas and travel restrictions, check the CDC travel website: wwwnc.cdc.gov/travel/notices  If you live in, or must travel to, an area where COVID-19 is a risk, take precautions to avoid infection. ? Stay away from people who are sick. ? Wash your hands often with soap and water for 20 seconds. If soap and water   are not available, use an alcohol-based hand sanitizer. ? Avoid touching your mouth, face, eyes, or nose. ? Avoid going out in public, follow guidance from your state and local health authorities. ? If you must go out in public, wear a cloth face covering or face mask. Make sure your mask covers your nose and mouth. ? Avoid crowded indoor spaces. Stay at least 6 feet (2 meters) away from others. ? Disinfect objects and surfaces that are frequently touched every day. This may include:  Counters and tables.  Doorknobs and light switches.  Sinks and faucets.  Electronics, such as phones, remote controls, keyboards, computers, and tablets. To protect others: If you have symptoms of COVID-19, take steps to prevent the virus from spreading to others.  If you think you have a COVID-19 infection, contact  your health care provider right away. Tell your health care team that you think you may have a COVID-19 infection.  Stay home. Leave your house only to seek medical care. Do not use public transport.  Do not travel while you are sick.  Wash your hands often with soap and water for 20 seconds. If soap and water are not available, use alcohol-based hand sanitizer.  Stay away from other members of your household. Let healthy household members care for children and pets, if possible. If you have to care for children or pets, wash your hands often and wear a mask. If possible, stay in your own room, separate from others. Use a different bathroom.  Make sure that all people in your household wash their hands well and often.  Cough or sneeze into a tissue or your sleeve or elbow. Do not cough or sneeze into your hand or into the air.  Wear a cloth face covering or face mask. Make sure your mask covers your nose and mouth. Where to find more information  Centers for Disease Control and Prevention: www.cdc.gov/coronavirus/2019-ncov/index.html  World Health Organization: www.who.int/health-topics/coronavirus Contact a health care provider if:  You live in or have traveled to an area where COVID-19 is a risk and you have symptoms of the infection.  You have had contact with someone who has COVID-19 and you have symptoms of the infection. Get help right away if:  You have trouble breathing.  You have pain or pressure in your chest.  You have confusion.  You have bluish lips and fingernails.  You have difficulty waking from sleep.  You have symptoms that get worse. These symptoms may represent a serious problem that is an emergency. Do not wait to see if the symptoms will go away. Get medical help right away. Call your local emergency services (911 in the [place] Do not drive yourself to the hospital. Let the emergency medical personnel know if you think you have  COVID-19. Summary  COVID-19 is a respiratory infection that is caused by a virus. It is also known as coronavirus disease or novel coronavirus. It can cause serious infections, such as pneumonia, acute respiratory distress syndrome, acute respiratory failure, or sepsis.  The virus that causes COVID-19 is contagious. This means that it can spread from person to person through droplets from breathing, speaking, singing, coughing, or sneezing.  You are more likely to develop a serious illness if you are 50 years of age or older, have a weak immune system, live in a nursing home, or have chronic disease.  There is no medicine to treat COVID-19. Your health care provider will talk with you about ways to treat your symptoms.    Take steps to protect yourself and others from infection. Wash your hands often and disinfect objects and surfaces that are frequently touched every day. Stay away from people who are sick and wear a mask if you are sick. This information is not intended to replace advice given to you by your health care provider. Make sure you discuss any questions you have with your health care provider. Document Revised: 07/09/2019 Document Reviewed: 10/15/2018 Elsevier Patient Education  2020 Elsevier Inc. What types of side effects do monoclonal antibody drugs cause?  Common side effects  In general, the more common side effects caused by monoclonal antibody drugs include: . Allergic reactions, such as hives or itching . Flu-like signs and symptoms, including chills, fatigue, fever, and muscle aches and pains . Nausea, vomiting . Diarrhea . Skin rashes . Low blood pressure   The CDC is recommending patients who receive monoclonal antibody treatments wait at least 90 days before being vaccinated.  Currently, there are no data on the safety and efficacy of mRNA COVID-19 vaccines in persons who received monoclonal antibodies or convalescent plasma as part of COVID-19 treatment. Based  on the estimated half-life of such therapies as well as evidence suggesting that reinfection is uncommon in the 90 days after initial infection, vaccination should be deferred for at least 90 days, as a precautionary measure until additional information becomes available, to avoid interference of the antibody treatment with vaccine-induced immune responses. 

## 2020-06-02 NOTE — Progress Notes (Signed)
.   Diagnosis: COVID-19  Physician:dr wright   Procedure: [facility] Med: casirivimab\imdevimab infusion - Provided patient with casirivimab\imdevimab fact sheet for patients, parents and caregivers prior to infusion.  Complications: No immediate complications noted.  Discharge: Discharged home   Zyria Fiscus S Crystin Lechtenberg 06/02/2020  

## 2020-06-02 NOTE — Progress Notes (Signed)
I connected by phone with Kellie Fisher on 06/02/2020 at 9:05 AM to discuss the potential use of a new treatment for mild to moderate COVID-19 viral infection in non-hospitalized patients.  This patient is a 39 y.o. female that meets the FDA criteria for Emergency Use Authorization of COVID monoclonal antibody casirivimab/imdevimab.  Has a (+) direct SARS-CoV-2 viral test result (Will bring results)  Has mild or moderate COVID-19   Is NOT hospitalized due to COVID-19  Is within 10 days of symptom onset (05/28/20)   Has at least one of the high risk factor(s) for progression to severe COVID-19 and/or hospitalization as defined in EUA.  Specific high risk criteria : BMI > 25 and Diabetes   I have spoken and communicated the following to the patient or parent/caregiver regarding COVID monoclonal antibody treatment:  1. FDA has authorized the emergency use for the treatment of mild to moderate COVID-19 in adults and pediatric patients with positive results of direct SARS-CoV-2 viral testing who are 47 years of age and older weighing at least 40 kg, and who are at high risk for progressing to severe COVID-19 and/or hospitalization.  2. The significant known and potential risks and benefits of COVID monoclonal antibody, and the extent to which such potential risks and benefits are unknown.  3. Information on available alternative treatments and the risks and benefits of those alternatives, including clinical trials.  4. Patients treated with COVID monoclonal antibody should continue to self-isolate and use infection control measures (e.g., wear mask, isolate, social distance, avoid sharing personal items, clean and disinfect "high touch" surfaces, and frequent handwashing) according to CDC guidelines.   5. The patient or parent/caregiver has the option to accept or refuse COVID monoclonal antibody treatment.  After reviewing this information with the patient, The patient agreed to proceed  with receiving casirivimab\imdevimab infusion and will be provided a copy of the Fact sheet prior to receiving the infusion. Kellie Fisher 06/02/2020 9:05 AM

## 2020-07-27 ENCOUNTER — Other Ambulatory Visit: Payer: Self-pay | Admitting: Family Medicine

## 2020-07-27 ENCOUNTER — Ambulatory Visit
Admission: RE | Admit: 2020-07-27 | Discharge: 2020-07-27 | Disposition: A | Discharge status: Home / Self Care | Payer: Medicaid Other | Source: Ambulatory Visit | Attending: Family Medicine | Admitting: Family Medicine

## 2020-07-27 ENCOUNTER — Other Ambulatory Visit: Payer: Self-pay

## 2020-07-27 DIAGNOSIS — R52 Pain, unspecified: Secondary | ICD-10-CM

## 2020-08-01 ENCOUNTER — Other Ambulatory Visit: Payer: Self-pay | Admitting: Family Medicine

## 2020-08-01 ENCOUNTER — Ambulatory Visit
Admission: RE | Admit: 2020-08-01 | Discharge: 2020-08-01 | Disposition: A | Discharge status: Home / Self Care | Payer: Medicaid Other | Source: Ambulatory Visit | Attending: Family Medicine | Admitting: Family Medicine

## 2020-08-01 DIAGNOSIS — M5410 Radiculopathy, site unspecified: Secondary | ICD-10-CM

## 2020-09-24 ENCOUNTER — Other Ambulatory Visit: Payer: Self-pay | Admitting: Allergy and Immunology

## 2021-07-04 ENCOUNTER — Other Ambulatory Visit: Payer: Self-pay

## 2021-07-04 ENCOUNTER — Emergency Department (HOSPITAL_COMMUNITY): Payer: Medicaid Other

## 2021-07-04 ENCOUNTER — Emergency Department (HOSPITAL_COMMUNITY)
Admission: EM | Admit: 2021-07-04 | Discharge: 2021-07-05 | Disposition: A | Discharge status: Home / Self Care | Payer: Medicaid Other | Attending: Emergency Medicine | Admitting: Emergency Medicine

## 2021-07-04 DIAGNOSIS — J069 Acute upper respiratory infection, unspecified: Secondary | ICD-10-CM | POA: Diagnosis not present

## 2021-07-04 DIAGNOSIS — H6503 Acute serous otitis media, bilateral: Secondary | ICD-10-CM | POA: Diagnosis not present

## 2021-07-04 DIAGNOSIS — J029 Acute pharyngitis, unspecified: Secondary | ICD-10-CM | POA: Diagnosis present

## 2021-07-04 DIAGNOSIS — Z20822 Contact with and (suspected) exposure to covid-19: Secondary | ICD-10-CM | POA: Insufficient documentation

## 2021-07-04 DIAGNOSIS — Z794 Long term (current) use of insulin: Secondary | ICD-10-CM | POA: Diagnosis not present

## 2021-07-04 DIAGNOSIS — E119 Type 2 diabetes mellitus without complications: Secondary | ICD-10-CM | POA: Insufficient documentation

## 2021-07-04 LAB — COMPREHENSIVE METABOLIC PANEL
ALT: 31 U/L (ref 0–44)
AST: 38 U/L (ref 15–41)
Albumin: 3.4 g/dL — ABNORMAL LOW (ref 3.5–5.0)
Alkaline Phosphatase: 74 U/L (ref 38–126)
Anion gap: 8 (ref 5–15)
BUN: 8 mg/dL (ref 6–20)
CO2: 22 mmol/L (ref 22–32)
Calcium: 9 mg/dL (ref 8.9–10.3)
Chloride: 107 mmol/L (ref 98–111)
Creatinine, Ser: 0.61 mg/dL (ref 0.44–1.00)
GFR, Estimated: >60 mL/min (ref >60)
Glucose, Bld: 150 mg/dL — ABNORMAL HIGH (ref 70–99)
Potassium: 3.6 mmol/L (ref 3.5–5.1)
Sodium: 137 mmol/L (ref 135–145)
Total Bilirubin: 0.3 mg/dL (ref 0.3–1.2)
Total Protein: 6.8 g/dL (ref 6.5–8.1)

## 2021-07-04 LAB — CBC WITH DIFFERENTIAL/PLATELET
Abs Immature Granulocytes: 0.06 10*3/uL (ref 0.00–0.07)
Basophils Absolute: 0 10*3/uL (ref 0.0–0.1)
Basophils Relative: 0 %
Eosinophils Absolute: 0.4 10*3/uL (ref 0.0–0.5)
Eosinophils Relative: 4 %
HCT: 41.1 % (ref 36.0–46.0)
Hemoglobin: 13.4 g/dL (ref 12.0–15.0)
Immature Granulocytes: 1 %
Lymphocytes Relative: 24 %
Lymphs Abs: 2.2 10*3/uL (ref 0.7–4.0)
MCH: 28 pg (ref 26.0–34.0)
MCHC: 32.6 g/dL (ref 30.0–36.0)
MCV: 86 fL (ref 80.0–100.0)
Monocytes Absolute: 0.6 10*3/uL (ref 0.1–1.0)
Monocytes Relative: 7 %
Neutro Abs: 5.8 10*3/uL (ref 1.7–7.7)
Neutrophils Relative %: 64 %
Platelets: 297 10*3/uL (ref 150–400)
RBC: 4.78 MIL/uL (ref 3.87–5.11)
RDW: 12.9 % (ref 11.5–15.5)
WBC: 9.1 10*3/uL (ref 4.0–10.5)
nRBC: 0 % (ref 0.0–0.2)

## 2021-07-04 LAB — URINALYSIS, ROUTINE W REFLEX MICROSCOPIC
Bilirubin Urine: NEGATIVE
Glucose, UA: NEGATIVE mg/dL
Hgb urine dipstick: NEGATIVE
Ketones, ur: 5 mg/dL — AB
Leukocytes,Ua: NEGATIVE
Nitrite: NEGATIVE
Protein, ur: NEGATIVE mg/dL
Specific Gravity, Urine: 1.033 — ABNORMAL HIGH (ref 1.005–1.030)
pH: 5 (ref 5.0–8.0)

## 2021-07-04 LAB — I-STAT BETA HCG BLOOD, ED (MC, WL, AP ONLY): I-stat hCG, quantitative: <5 m[IU]/mL (ref <5)

## 2021-07-04 NOTE — ED Provider Notes (Signed)
Emergency Medicine Provider Triage Evaluation Note  Kellie Fisher , a 40 y.o. female  was evaluated in triage.  Pt complains of sore throat.  She has had a slight cough for the past few days.  Today she developed sore throat.  She has had difficulty speaking over the past 2 to 3 hours.  She has lost her voice.  She also reports pain when she tries to speak.  She denies any shortness of breath or difficulty breathing.  She has been taking [place] Alka-Seltzer cold and flu without significant relief in her symptoms.  She did have a right sided maxillary tooth pulled last week.    No rashes, no shortness of breath or fever.  No new meds. No N/V/D.  No lisinopril use.   Review of Systems  Positive: Sore throat, facial swelling.  Negative: Shortness of breath, chest pain  Physical Exam  BP (!) 155/108 (BP Location: Left Arm)   Pulse 97   Temp 97.9 F (36.6 C) (Oral)   SpO2 98%  Gen:   Awake, no distress   Resp:  Normal effort, no stridor, lungs CTAB.   MSK:   Moves extremities without difficulty  Other:  Mild facial swelling, right greater than left.  No trismus.  No obvious oropharyngeal edema.  No elevation of the floor of the mouth, uvula is midline.  Voice is hoarse, no shortness of breath.   Medical Decision Making  Medically screening exam initiated at 9:02 PM.  Appropriate orders placed.  REBECCAH IVINS was informed that the remainder of the evaluation will be completed by another provider, this initial triage assessment does not replace that evaluation, and the importance of remaining in the ED until their evaluation is complete.  Note: Portions of this report may have been transcribed using voice recognition software. Every effort was made to ensure accuracy; however, inadvertent computerized transcription errors may be present    Ollen Gross 07/04/21 2107    Dorie Rank, MD 07/05/21 1429

## 2021-07-04 NOTE — ED Triage Notes (Addendum)
Pt c/o sore throat and left jaw swelling started this evening. Pt denies N/V/D. No reports of SOB. Pt a/ox4.

## 2021-07-05 LAB — RESP PANEL BY RT-PCR (FLU A&B, COVID) ARPGX2
Influenza A by PCR: NEGATIVE
Influenza B by PCR: NEGATIVE
SARS Coronavirus 2 by RT PCR: NEGATIVE

## 2021-07-05 MED ORDER — AMOXICILLIN-POT CLAVULANATE 875-125 MG PO TABS: 1.0000 | ORAL_TABLET | Freq: Two times a day (BID) | ORAL | 0 refills | Status: DC

## 2021-07-05 NOTE — ED Provider Notes (Signed)
[facility] DEPT Provider Note   CSN: [id number] Arrival date & time: 07/04/21  2017     History Chief Complaint  Patient presents with   Sore Throat    Kellie Fisher is a 40 y.o. female.  Patient presents to the emergency department with a chief complaint of sore throat and cough.  Symptoms started a couple of days ago and have gradually worsened.  She states that her sore throat made it hard to speak earlier.  She denies any fevers or chills.  Denies having recently taken any COVID test.  The history is provided by the patient. No language interpreter was used.      Past Medical History:  Diagnosis Date   Ankle fracture    Anxiety    Daytime somnolence    Depression    Diabetes mellitus without complication (HCC)    Eczema    Migraine    PTSD (post-traumatic stress disorder)    Tumor containing calcium    Urticaria     Patient Active Problem List   Diagnosis Date Noted   Cellulitis of oral soft tissues 04/13/2017   Depression with anxiety 04/13/2017    Past Surgical History:  Procedure Laterality Date   CHOLECYSTECTOMY     GANGLION CYST EXCISION Left    HERNIA REPAIR       OB History   No obstetric history on file.     Family History  Problem Relation Age of Onset   Mental illness Mother    Other Mother        reflex sympathetic dystrophy   Alcohol abuse Father    Autism Son        x2    Social History   Tobacco Use   Smoking status: Never   Smokeless tobacco: Never  Vaping Use   Vaping Use: Never used  Substance Use Topics   Alcohol use: No    Comment: occasional   Drug use: No    Home Medications Prior to Admission medications   Medication Sig Start Date End Date Taking? Authorizing Provider  amoxicillin-clavulanate (AUGMENTIN) 875-125 MG tablet Take 1 tablet by mouth every 12 (twelve) hours. 07/05/21  Yes Montine Circle, PA-C  acetaminophen (TYLENOL) 500 MG tablet Take 1,000 mg by mouth every 6  (six) hours as needed. For pain.    [provider]  buPROPion (WELLBUTRIN XL) 300 MG 24 hr tablet TAKE 1 TABLET BY MOUTH EVERY DAY IN THE MORNING 03/06/19   [provider]  BYDUREON 2 MG PEN Inject 2 mg into the skin once a week. 02/28/17   [provider]  EMGALITY 120 MG/ML SOAJ Inject 120 mg into the skin every 30 (thirty) days. 04/01/18   [provider]  escitalopram (LEXAPRO) 20 MG tablet Take 20 mg by mouth daily. 03/30/17   [provider]  etonogestrel-ethinyl estradiol (NUVARING) 0.12-0.015 MG/24HR vaginal ring Place 1 each vaginally every 28 (twenty-eight) days. Insert vaginally and leave in place for 3 consecutive weeks, then remove for 1 week.    [provider]  famotidine (PEPCID) 40 MG tablet TAKE 1 TABLET BY MOUTH TWICE A DAY 12/06/19   Kozlow, Donnamarie Poag, MD  fluticasone (FLONASE) 50 MCG/ACT nasal spray SHAKE LQ AND U 2 SPRAYS IEN D 11/24/18   [provider]  lamoTRIgine (LAMICTAL) 150 MG tablet Take 150 mg by mouth at bedtime. 04/16/18   [provider]  Prenatal Vit-Fe Fumarate-FA (PRENATAL VITAMIN PLUS LOW IRON) 27-1 MG TABS  Take 1 tablet by mouth daily. 06/04/19   [provider]  rizatriptan (MAXALT-MLT) 10 MG disintegrating tablet TAKE 1 TABLET BY MOUTH AT ONSET OF MIGRAINE, MAY REPEAT IN 2 HOURS 03/12/19   [provider]  albuterol (PROVENTIL HFA;VENTOLIN HFA) 108 (90 Base) MCG/ACT inhaler Inhale 1-2 puffs into the lungs every 6 (six) hours as needed for wheezing or shortness of breath. Patient not taking: Reported on 10/27/2019 10/17/17 04/14/20  Zigmund Gottron, NP  diphenhydrAMINE (BENADRYL) 25 MG tablet Take 1 tablet (25 mg total) by mouth every 6 (six) hours. Patient not taking: Reported on 10/27/2019 04/17/19 04/14/20  Dorie Rank, MD    Allergies    Azithromycin, Nsaids, and Benzoyl peroxide  Review of Systems   Review of Systems  All other systems reviewed and are negative.  Physical  Exam Updated Vital Signs BP (!) 170/89   Pulse 95   Temp 97.9 F (36.6 C) (Oral)   Resp 16   SpO2 98%   Physical Exam Vitals and nursing note reviewed.  Constitutional:      General: She is not in acute distress.    Appearance: She is well-developed.  HENT:     Head: Normocephalic and atraumatic.     Ears:     Comments: Mild erythema of of bilateral TMs with serous effusion Oropharynx is clear, no edema, no abscess, no stridor, normal phonation Eyes:     Conjunctiva/sclera: Conjunctivae normal.  Cardiovascular:     Rate and Rhythm: Normal rate and regular rhythm.     Heart sounds: No murmur heard. Pulmonary:     Effort: Pulmonary effort is normal. No respiratory distress.     Breath sounds: Normal breath sounds.  Abdominal:     Palpations: Abdomen is soft.     Tenderness: There is no abdominal tenderness.  Musculoskeletal:        General: Normal range of motion.     Cervical back: Neck supple.  Skin:    General: Skin is warm and dry.  Neurological:     Mental Status: She is alert and oriented to person, place, and time.  Psychiatric:        Mood and Affect: Mood normal.        Behavior: Behavior normal.    ED Results / Procedures / Treatments   Labs (all labs ordered are listed, but only abnormal results are displayed) Labs Reviewed  COMPREHENSIVE METABOLIC PANEL - Abnormal; Notable for the following components:      Result Value   Glucose, Bld 150 (*)    Albumin 3.4 (*)    All other components within normal limits  URINALYSIS, ROUTINE W REFLEX MICROSCOPIC - Abnormal; Notable for the following components:   Specific Gravity, Urine 1.033 (*)    Ketones, ur 5 (*)    All other components within normal limits  RESP PANEL BY RT-PCR (FLU A&B, COVID) ARPGX2  CBC WITH DIFFERENTIAL/PLATELET  I-STAT BETA HCG BLOOD, ED ([facility], [facility], [facility] ONLY)    EKG None  Radiology DG Chest 2 View  Result Date: 07/04/2021 CLINICAL DATA:  Sore throat. EXAM: CHEST - 2 VIEW COMPARISON:   April 14, 2020 FINDINGS: The heart size and mediastinal contours are within normal limits. Both lungs are clear. The visualized skeletal structures are unremarkable. IMPRESSION: No active cardiopulmonary disease. Electronically Signed   By: Virgina Norfolk M.D.   On: 07/04/2021 21:18    Procedures Procedures   Medications Ordered in ED Medications - No data to display  ED  Course  I have reviewed the triage vital signs and the nursing notes.  Pertinent labs & imaging results that were available during my care of the patient were reviewed by me and considered in my medical decision making (see chart for details).    MDM Rules/Calculators/A&P                           MYRELLA FAHS was evaluated in Emergency Department on 07/05/2021 for the symptoms described in the history of present illness. She was evaluated in the context of the global COVID-19 pandemic, which necessitated consideration that the patient might be at risk for infection with the SARS-CoV-2 virus that causes COVID-19. Institutional protocols and algorithms that pertain to the evaluation of patients at risk for COVID-19 are in a state of rapid change based on information released by regulatory bodies including the CDC and federal and state organizations. These policies and algorithms were followed during the patient's care in the ED.  Patient symptoms are consistent with viral etiology.  I suspect COVID or flu.  I have sent tests for these.  If tests are negative, then could be sinusitis, and could consider treatment with Augmentin.  I discussed this with the patient.  We will discharged home with a prescription, but she is instructed not to take it unless COVID and flu are negative.  Vital signs are stable.  She is in no acute distress. Final Clinical Impression(s) / ED Diagnoses Final diagnoses:  Upper respiratory tract infection, unspecified type    Rx / DC Orders ED Discharge Orders          Ordered     amoxicillin-clavulanate (AUGMENTIN) 875-125 MG tablet  Every 12 hours        07/05/21 0154             Montine Circle, PA-C 35/70/17 [id number]    Delora Fuel, MD 03/00/92 [phone]

## 2021-07-05 NOTE — Discharge Instructions (Addendum)
I will call and notify you if your COVID or flu tests are positive.  If you do not hear from me, you can begin the antibiotic.

## 2021-07-27 IMAGING — US US ABDOMEN LIMITED
1 series · 14 of 25 positions shown · non-contrast
Comparison: CT abdomen and pelvis January 25, 2011

CLINICAL DATA: Elevated liver enzymes. Nonalcoholic steatohepatitis

EXAM:
ULTRASOUND ABDOMEN LIMITED RIGHT UPPER QUADRANT

[Series 1: us abdomen limited · 0.29mm/px · 14 of 25 slices shown]
[im 1/25]
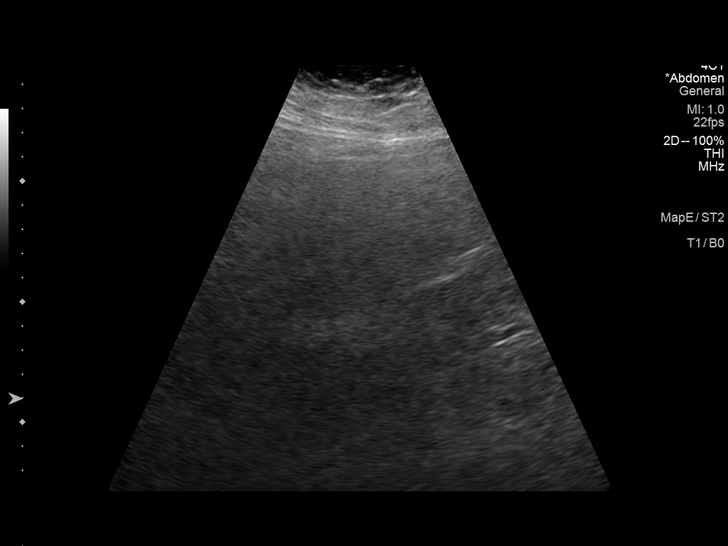
[im 3/25]
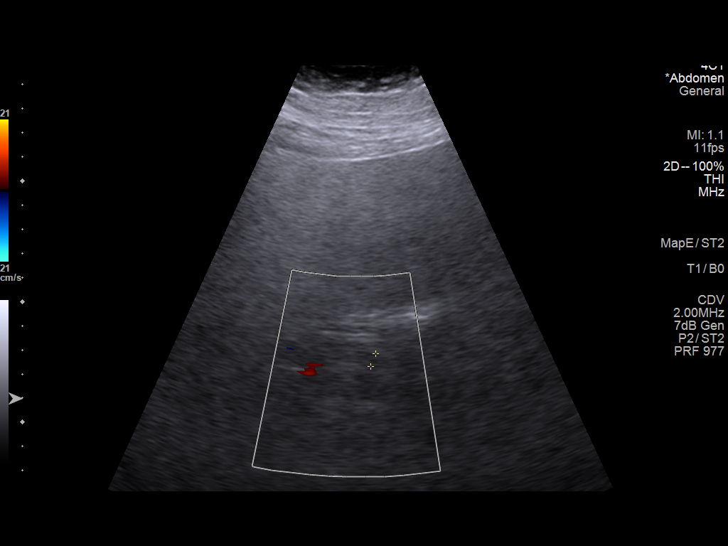
[im 5/25]
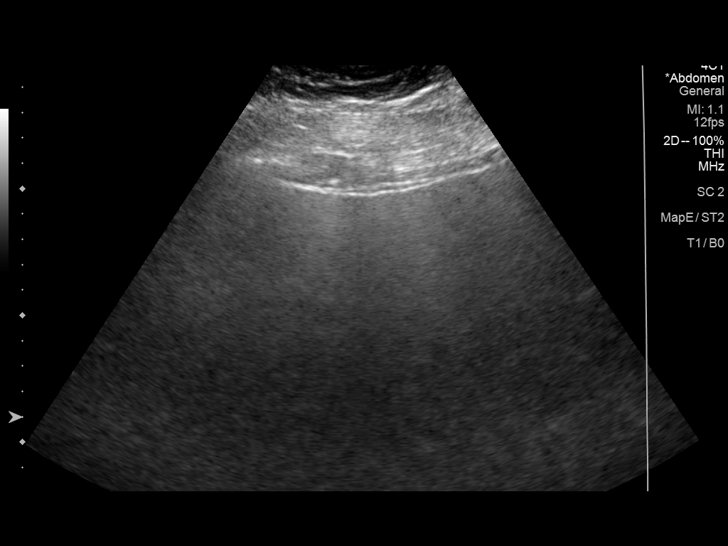
[im 7/25]
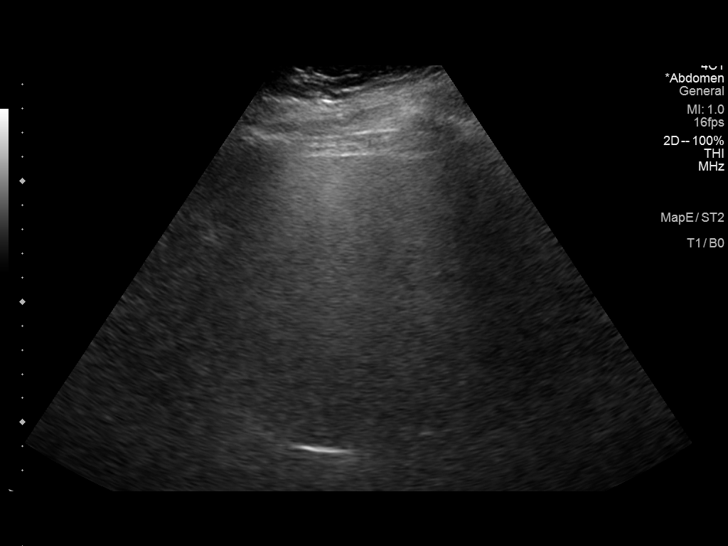
[im 9/25]
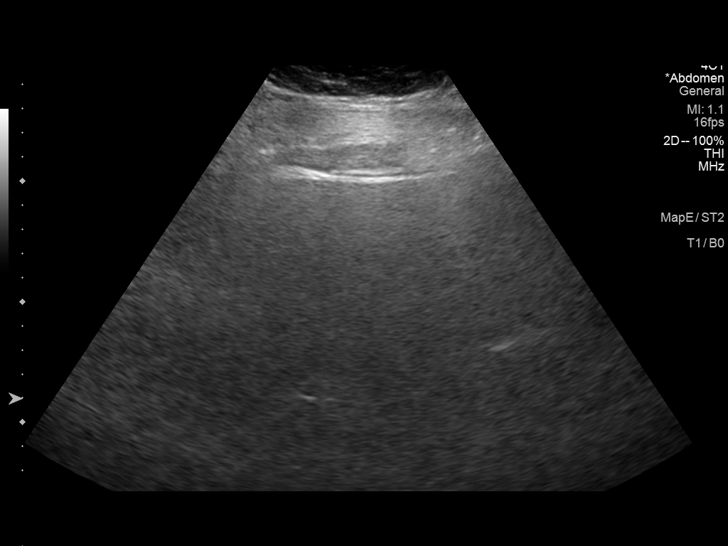
[im 10/25]
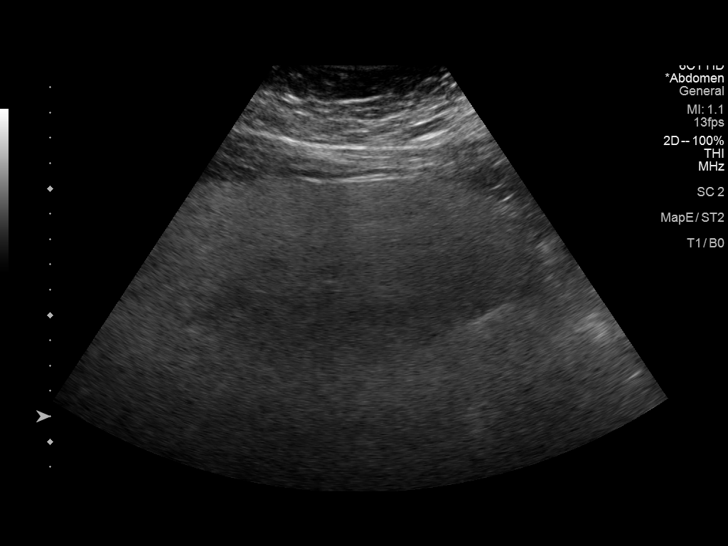
[im 12/25]
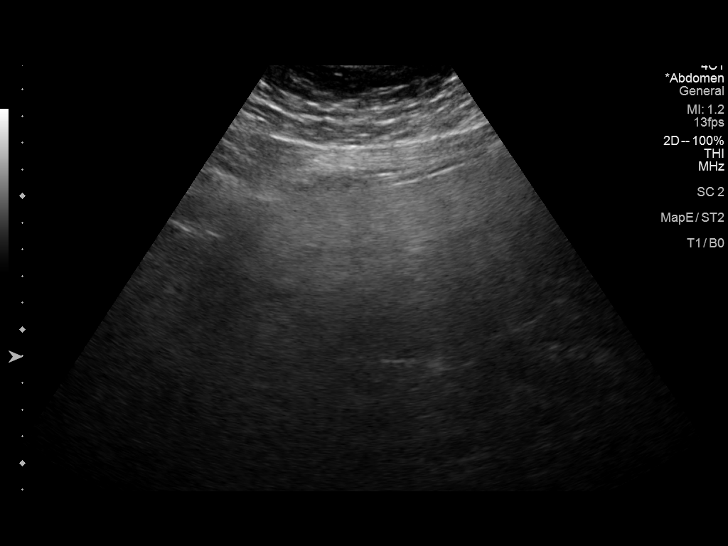
[im 14/25]
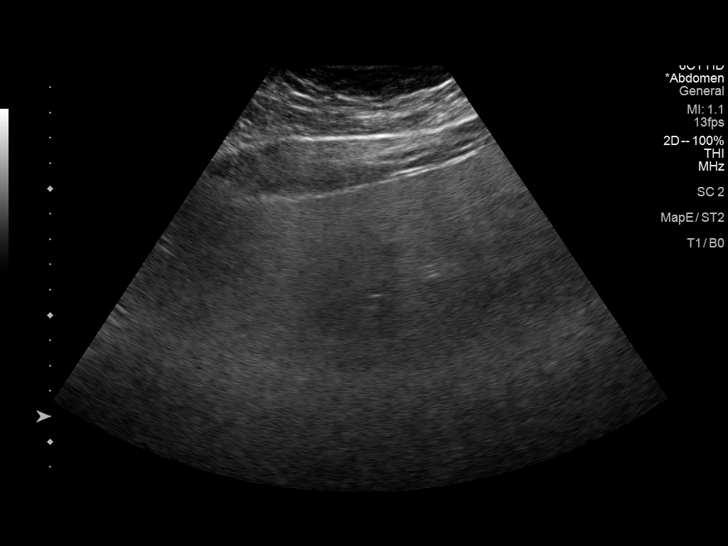
[im 16/25]
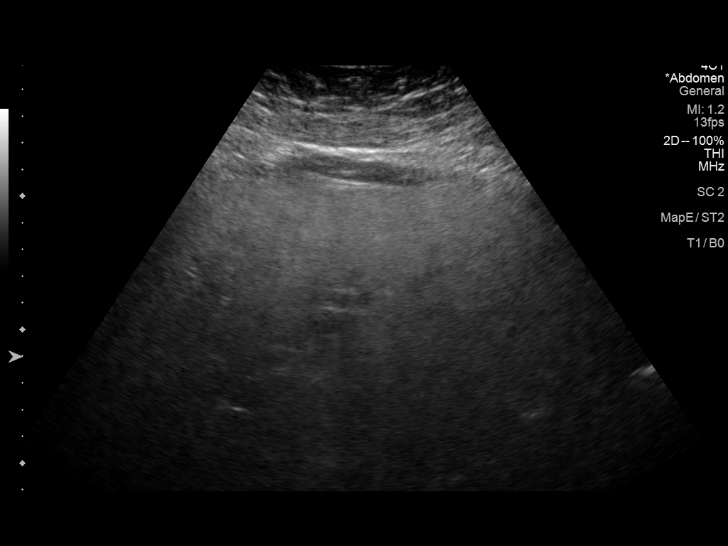
[im 17/25]
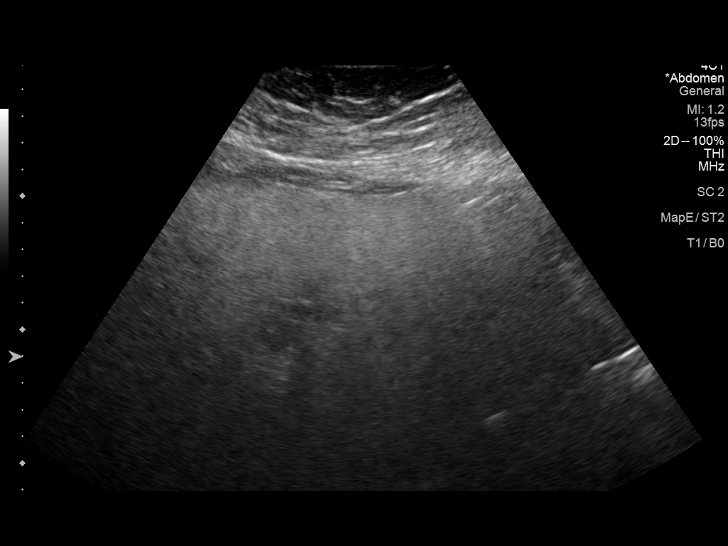
[im 19/25]
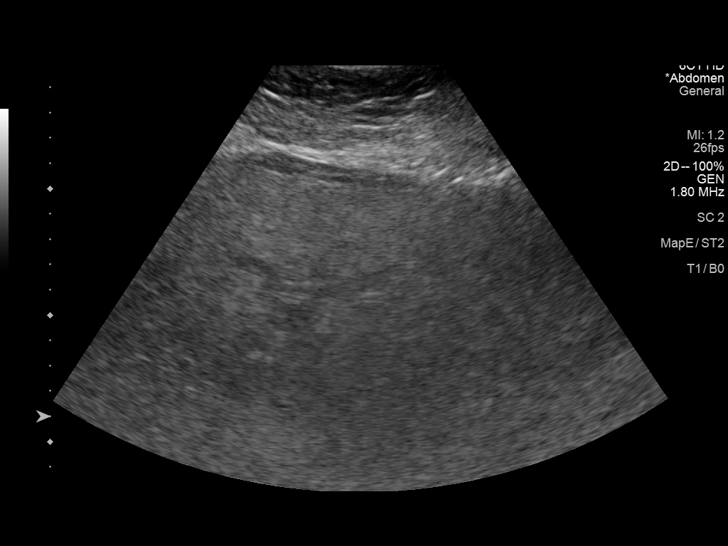
[im 21/25]
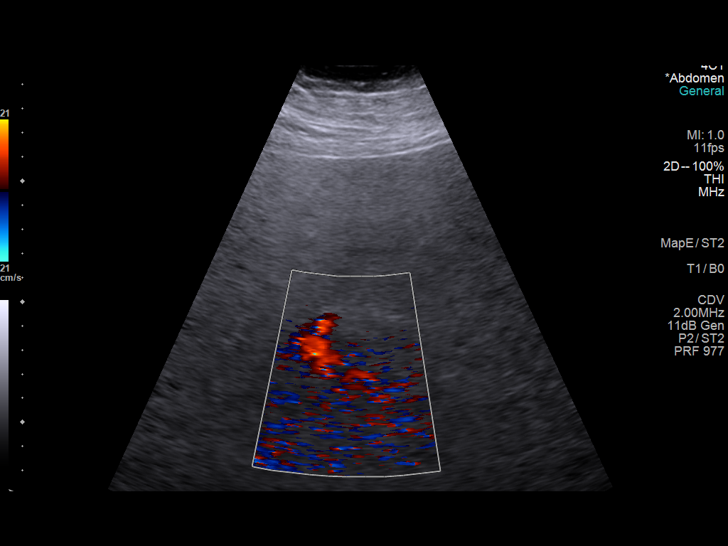
[im 23/25]
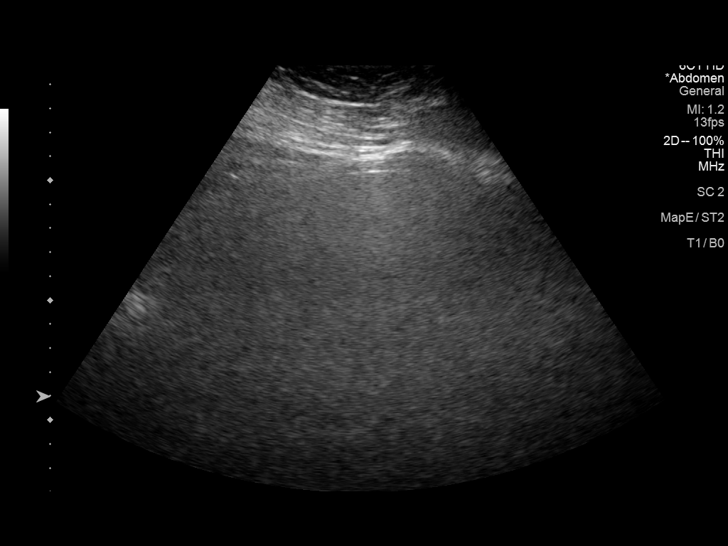
[im 25/25]
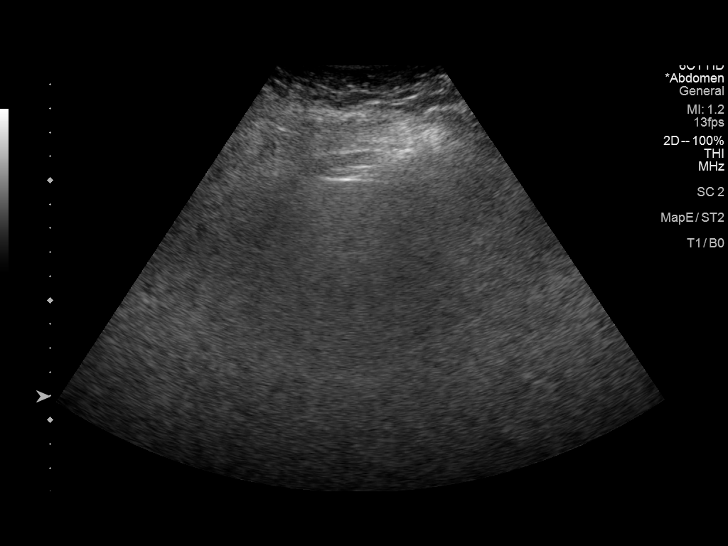

[14 of 25 positions shown; findings below may reference images not displayed]

FINDINGS: Gallbladder:

Surgically absent.

Common bile duct:

Diameter: 6 mm. No intrahepatic or extrahepatic biliary duct
dilatation. Note that portions of the common bile duct are obscured
by gas.

Liver:

No focal lesion identified. Liver echogenicity is increased
markedly. Portal vein is patent on color Doppler imaging with normal
direction of blood flow towards the liver.

Other: None.
IMPRESSION: 1. Marked increase in liver echogenicity, a finding indicative of
hepatic steatosis. While no focal liver lesions are evident on this
study, it must be cautioned that the sensitivity of ultrasound for
detection of focal liver lesions is diminished significantly in this
circumstance.

2. Gallbladder absent. Limited visualization of the biliary duct
system. No apparent biliary duct dilatation.

## 2023-01-22 ENCOUNTER — Ambulatory Visit (INDEPENDENT_AMBULATORY_CARE_PROVIDER_SITE_OTHER): Payer: Medicaid Other

## 2023-01-22 ENCOUNTER — Ambulatory Visit: Payer: Medicaid Other | Admitting: Podiatry

## 2023-01-22 DIAGNOSIS — M79671 Pain in right foot: Secondary | ICD-10-CM

## 2023-01-22 DIAGNOSIS — M19071 Primary osteoarthritis, right ankle and foot: Secondary | ICD-10-CM

## 2023-01-22 MED ORDER — TRIAMCINOLONE ACETONIDE 40 MG/ML IJ SUSP
20.0000 mg | Freq: Once | INTRAMUSCULAR | Status: AC
  Administered 2023-01-22: 20 mg

## 2023-01-22 NOTE — Progress Notes (Signed)
Chief Complaint  Patient presents with   Foot Problem    Patient came in today for right foot and ankle pain, lateral side of the ankle, rate of pain 8 out of 10 by the end of the day, sharp and stiff, X-Rays done today     Subjective:  42 y.o. female presenting today as a new patient for evaluation of right ankle pain that has been chronic over the past several years.  Patient states that when she was 42 years old she sustained a right ankle fracture.  Treated conservatively.  She has had dull achiness ever since but over the last 5 years or so she has noticed an increase of pain and tenderness.  Presenting for further treatment and evaluation   Past Medical History:  Diagnosis Date   Ankle fracture    Anxiety    Daytime somnolence    Depression    Diabetes mellitus without complication (HCC)    Eczema    Migraine    PTSD (post-traumatic stress disorder)    Tumor containing calcium    Urticaria     Past Surgical History:  Procedure Laterality Date   CHOLECYSTECTOMY     GANGLION CYST EXCISION Left    HERNIA REPAIR      Allergies  Allergen Reactions   Azithromycin Other (See Comments)    Makes her crazy    Nsaids Anaphylaxis   Vitamin A Other (See Comments)    Sunburn Feel/Swelling   Benzoyl Peroxide     Objective / Physical Exam:  General:  The patient is alert and oriented x3 in no acute distress. Dermatology:  Skin is warm, dry and supple bilateral lower extremities. Negative for open lesions or macerations. Vascular:  Palpable pedal pulses bilaterally. No edema or erythema noted. Capillary refill within normal limits. Neurological:  Epicritic and protective threshold grossly intact bilaterally.  Musculoskeletal Exam:  Pain on palpation to the anterior lateral medial aspects of the patient's right ankle. Mild edema noted. Range of motion within normal limits to all pedal and ankle joints bilateral. Muscle strength 5/5 in all groups bilateral.   Radiographic  Exam RT foot and ankle 01/22/2023:  Normal osseous mineralization. Joint spaces preserved.  Tibiotalar joint appears to be mostly preserved.  There may be some slight cortical erosion and irregularity along the medial shoulder of the talus and medial gutter of the tibiotalar joint.  Assessment: 1. H/o ankle fracture RT. when patient was 42 years old 2.  Capsulitis RT ankle with mild DJD  Plan of Care:  1. Patient was evaluated. X-Rays reviewed.  2. Injection of 0.5 mL Celestone Soluspan injected in the patient's right ankle. 3.  Patient allergic to NSAIDs.  Recommend OTC Tylenol as needed 4.  Patient admits to walking barefoot or wearing flats.  Advised against this.  Recommend good supportive shoes and sneakers at all times advised against going barefoot 5.  Also discussed possible orthotics however the patient cannot afford them for now.  Recommend good supportive tennis shoes at [employer] running store  6.  Return to clinic as needed   Felecia Shelling, DPM [facility]  Dr. Felecia Shelling, DPM    [address], [place] [address]  Office [phone]  Fax [phone]

## 2023-02-04 ENCOUNTER — Other Ambulatory Visit: Payer: Self-pay | Admitting: Podiatry

## 2023-02-04 DIAGNOSIS — M79671 Pain in right foot: Secondary | ICD-10-CM

## 2023-02-04 DIAGNOSIS — M19071 Primary osteoarthritis, right ankle and foot: Secondary | ICD-10-CM

## 2023-07-06 IMAGING — CR DG CHEST 2V
2 series · 2 of 2 positions shown · non-contrast
Comparison: April 14, 2020

CLINICAL DATA: Sore throat.

EXAM:
CHEST - 2 VIEW

[w chest pa]
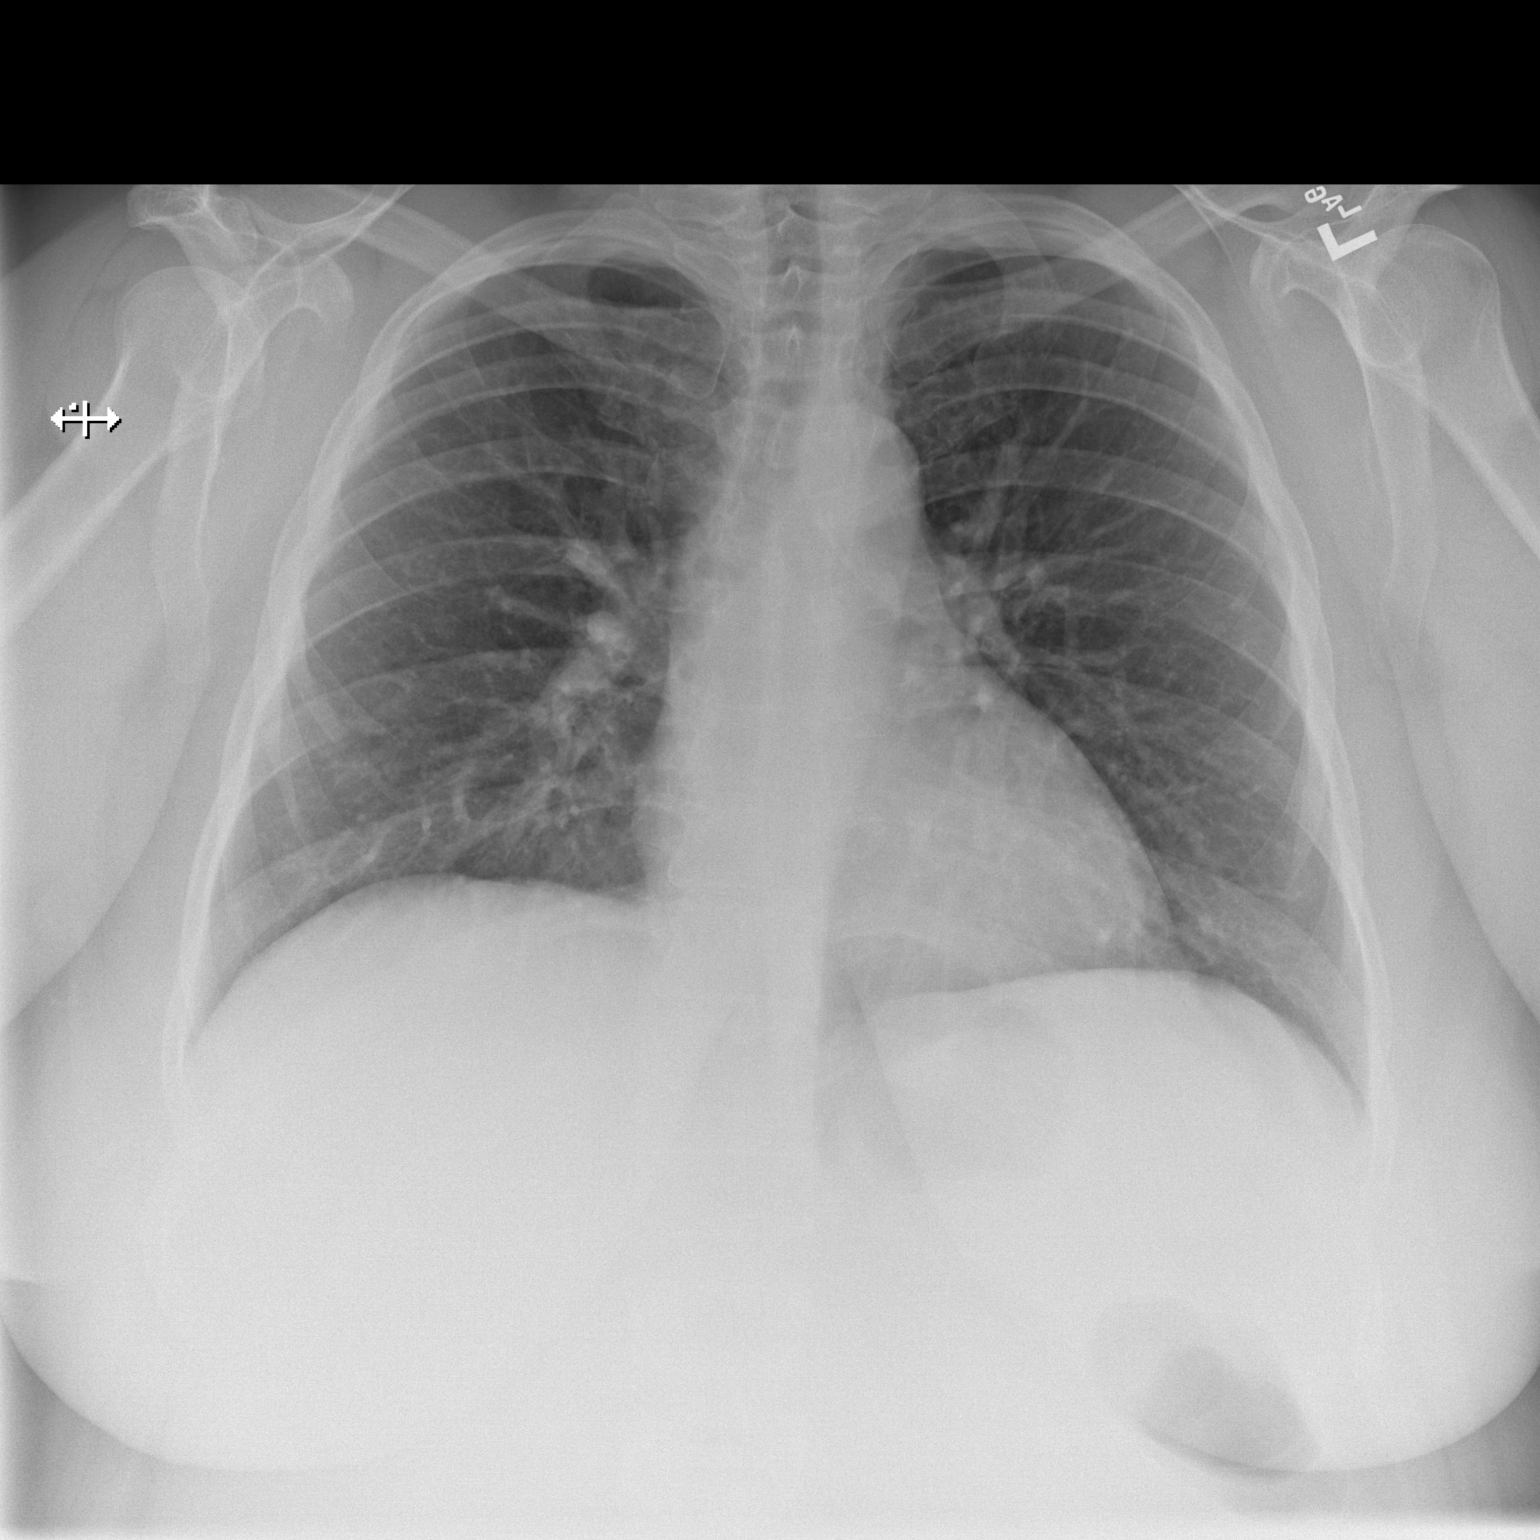

[w chest lat]
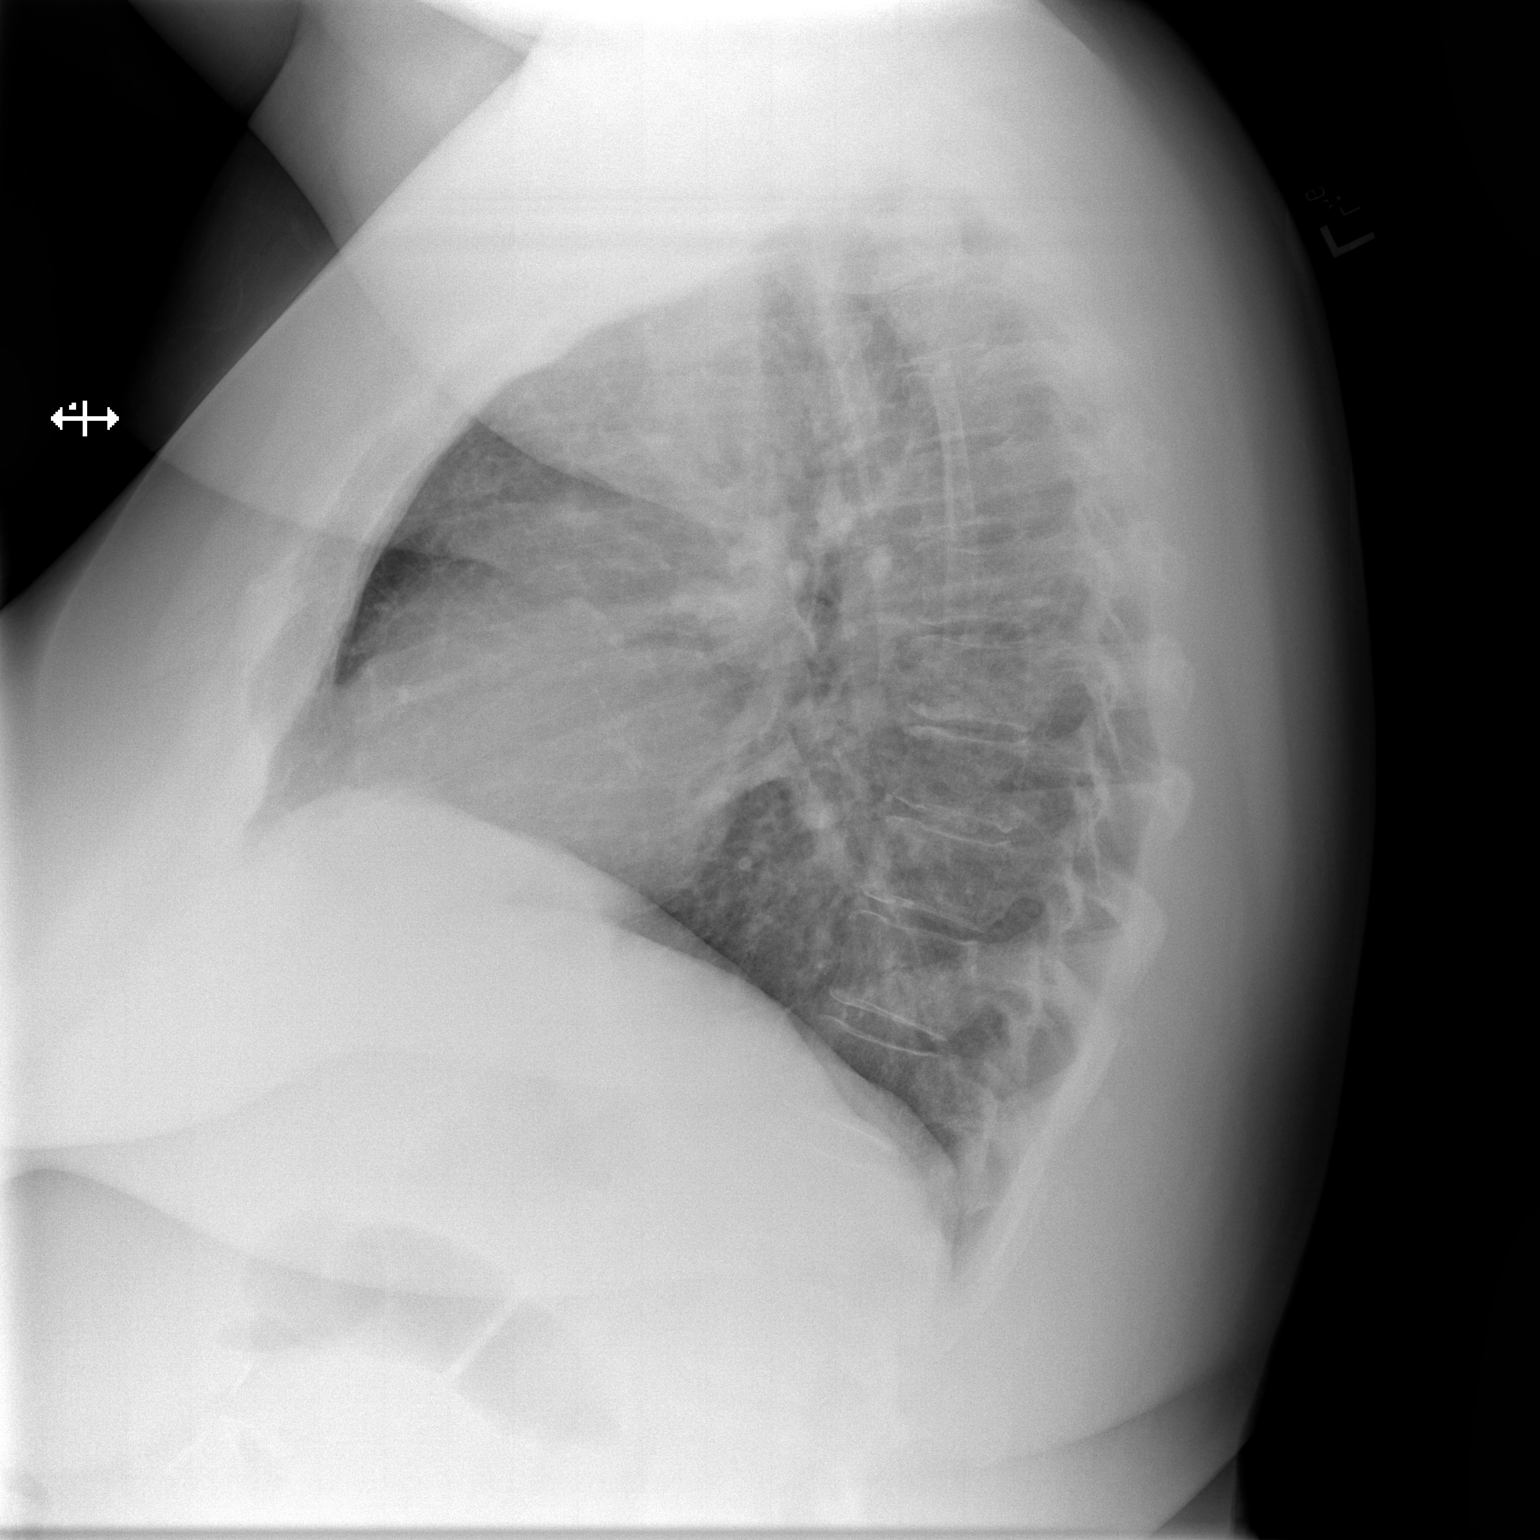

[2 of 2 positions shown; findings below may reference images not displayed]

FINDINGS: The heart size and mediastinal contours are within normal limits.
Both lungs are clear. The visualized skeletal structures are
unremarkable.
IMPRESSION: No active cardiopulmonary disease.

## 2023-07-10 ENCOUNTER — Ambulatory Visit
Admission: RE | Admit: 2023-07-10 | Discharge: 2023-07-10 | Disposition: A | Discharge status: Home / Self Care | Payer: Medicaid Other | Source: Ambulatory Visit | Attending: Family Medicine | Admitting: Family Medicine

## 2023-07-10 ENCOUNTER — Other Ambulatory Visit: Payer: Self-pay | Admitting: Family Medicine

## 2023-07-10 DIAGNOSIS — K5900 Constipation, unspecified: Secondary | ICD-10-CM

## 2023-08-23 ENCOUNTER — Ambulatory Visit: Payer: Medicaid Other

## 2023-08-23 ENCOUNTER — Ambulatory Visit
Admission: EM | Admit: 2023-08-23 | Discharge: 2023-08-23 | Disposition: A | Discharge status: Home / Self Care | Payer: Medicaid Other | Attending: Family Medicine | Admitting: Family Medicine

## 2023-08-23 DIAGNOSIS — J069 Acute upper respiratory infection, unspecified: Secondary | ICD-10-CM

## 2023-08-23 LAB — POC COVID19/FLU A&B COMBO
Covid Antigen, POC: NEGATIVE
Influenza A Antigen, POC: NEGATIVE
Influenza B Antigen, POC: NEGATIVE

## 2023-08-23 MED ORDER — PROMETHAZINE-DM 6.25-15 MG/5ML PO SYRP: 5.0000 mL | ORAL_SOLUTION | Freq: Four times a day (QID) | ORAL | 0 refills | Status: DC | PRN

## 2023-08-23 NOTE — ED Triage Notes (Signed)
Pt reports her ears hurt, chest discomfort, and throat pain x 3 days.    Took tylenol

## 2023-08-23 NOTE — Discharge Instructions (Signed)
Your COVID and flu testing was negative and your chest x-ray did not show any pneumonia.  In addition to over-the-counter pain and fever reducers, you may use Flonase twice a day, cold and congestion medications, humidifiers, saline sinus rinses.  I have sent in a cough syrup additionally.  Follow-up for worsening symptoms.

## 2023-08-23 NOTE — ED Provider Notes (Signed)
[facility]    CSN: [id number] Arrival date & time: 08/23/23  0807      History   Chief Complaint No chief complaint on file.   HPI Kellie Fisher is a 42 y.o. female.   Patient presenting today with 3-day history of bilateral ear pressure, productive cough, congestion, sore throat, intermittent low-grade fever, pleuritic chest pain.  Denies shortness of breath, wheezing, abdominal pain, nausea vomiting or diarrhea.  So far trying Tylenol and Mucinex with minimal relief.  No known history of chronic pulmonary disease.    Past Medical History:  Diagnosis Date   Ankle fracture    Anxiety    Daytime somnolence    Depression    Diabetes mellitus without complication (HCC)    Eczema    Migraine    PTSD (post-traumatic stress disorder)    Tumor containing calcium    Urticaria     Patient Active Problem List   Diagnosis Date Noted   Cellulitis of oral soft tissues 04/13/2017   Depression with anxiety 04/13/2017    Past Surgical History:  Procedure Laterality Date   CHOLECYSTECTOMY     GANGLION CYST EXCISION Left    HERNIA REPAIR      OB History   No obstetric history on file.      Home Medications    Prior to Admission medications   Medication Sig Start Date End Date Taking? Authorizing Provider  promethazine-dextromethorphan (PROMETHAZINE-DM) 6.25-15 MG/5ML syrup Take 5 mLs by mouth 4 (four) times daily as needed. 08/23/23  Yes Particia Nearing, PA-C  acetaminophen (TYLENOL) 500 MG tablet Take 1,000 mg by mouth every 6 (six) hours as needed. For pain.    [provider]  amoxicillin-clavulanate (AUGMENTIN) 875-125 MG tablet Take 1 tablet by mouth every 12 (twelve) hours. Patient not taking: Reported on 01/22/2023 07/05/21   Roxy Horseman, PA-C  buPROPion (WELLBUTRIN XL) 300 MG 24 hr tablet TAKE 1 TABLET BY MOUTH EVERY DAY IN THE MORNING 03/06/19   [provider]  BYDUREON 2 MG PEN Inject 2 mg into the skin once a week.  02/28/17   [provider]  EMGALITY 120 MG/ML SOAJ Inject 120 mg into the skin every 30 (thirty) days. 04/01/18   [provider]  escitalopram (LEXAPRO) 20 MG tablet Take 20 mg by mouth daily. 03/30/17   [provider]  etonogestrel-ethinyl estradiol (NUVARING) 0.12-0.015 MG/24HR vaginal ring Place 1 each vaginally every 28 (twenty-eight) days. Insert vaginally and leave in place for 3 consecutive weeks, then remove for 1 week.    [provider]  famotidine (PEPCID) 40 MG tablet TAKE 1 TABLET BY MOUTH TWICE A DAY 12/06/19   Kozlow, Alvira Philips, MD  fluticasone (FLONASE) 50 MCG/ACT nasal spray SHAKE LQ AND U 2 SPRAYS IEN D 11/24/18   [provider]  gabapentin (NEURONTIN) 600 MG tablet Take by mouth. 03/04/22   [provider]  HYDROcodone-acetaminophen (NORCO) 10-325 MG tablet TK 1 T PO TID PRN    [provider]  lamoTRIgine (LAMICTAL) 150 MG tablet Take 150 mg by mouth at bedtime. 04/16/18   [provider]  Prenatal Vit-Fe Fumarate-FA (PRENATAL VITAMIN PLUS LOW IRON) 27-1 MG TABS Take 1 tablet by mouth daily. Patient not taking: Reported on 01/22/2023 06/04/19   [provider]  rizatriptan (MAXALT-MLT) 10 MG disintegrating tablet TAKE 1 TABLET BY MOUTH AT ONSET OF MIGRAINE, MAY REPEAT IN 2 HOURS 03/12/19   [provider]  albuterol (PROVENTIL HFA;VENTOLIN HFA) 108 (  90 Base) MCG/ACT inhaler Inhale 1-2 puffs into the lungs every 6 (six) hours as needed for wheezing or shortness of breath. Patient not taking: Reported on 10/27/2019 10/17/17 04/14/20  Georgetta Haber, NP  diphenhydrAMINE (BENADRYL) 25 MG tablet Take 1 tablet (25 mg total) by mouth every 6 (six) hours. Patient not taking: Reported on 10/27/2019 04/17/19 04/14/20  Linwood Dibbles, MD    Family History Family History  Problem Relation Age of Onset   Mental illness Mother    Other Mother        reflex sympathetic dystrophy   Alcohol abuse Father    Autism Son         x2    Social History Social History   Tobacco Use   Smoking status: Never   Smokeless tobacco: Never  Vaping Use   Vaping status: Never Used  Substance Use Topics   Alcohol use: No    Comment: occasional   Drug use: No     Allergies   Azithromycin, Nsaids, Vitamin a, and Benzoyl peroxide   Review of Systems Review of Systems Per HPI  Physical Exam Triage Vital Signs ED Triage Vitals  Encounter Vitals Group     BP 08/23/23 0815 133/84     Systolic BP Percentile --      Diastolic BP Percentile --      Pulse Rate 08/23/23 0815 (!) 114     Resp 08/23/23 0815 20     Temp 08/23/23 0815 98.6 F (37 C)     Temp Source 08/23/23 0815 Oral     SpO2 08/23/23 0815 93 %     Weight --      Height --      Head Circumference --      Peak Flow --      Pain Score 08/23/23 0816 5     Pain Loc --      Pain Education --      Exclude from Growth Chart --    No data found.  Updated Vital Signs BP 133/84 (BP Location: Right Arm)   Pulse (!) 114   Temp 98.6 F (37 C) (Oral)   Resp 20   SpO2 93%   Visual Acuity Right Eye Distance:   Left Eye Distance:   Bilateral Distance:    Right Eye Near:   Left Eye Near:    Bilateral Near:     Physical Exam Vitals and nursing note reviewed.  Constitutional:      Appearance: Normal appearance.  HENT:     Head: Atraumatic.     Right Ear: External ear normal.     Left Ear: External ear normal.     Ears:     Comments: Bilateral middle ear effusions    Nose: Rhinorrhea present.     Mouth/Throat:     Mouth: Mucous membranes are moist.     Pharynx: Posterior oropharyngeal erythema present.  Eyes:     Extraocular Movements: Extraocular movements intact.     Conjunctiva/sclera: Conjunctivae normal.  Cardiovascular:     Rate and Rhythm: Normal rate and regular rhythm.     Heart sounds: Normal heart sounds.  Pulmonary:     Effort: Pulmonary effort is normal.     Breath sounds: Normal breath sounds. No wheezing or rales.   Musculoskeletal:        General: Normal range of motion.     Cervical back: Normal range of motion and neck supple.  Skin:    General: Skin is  warm and dry.  Neurological:     Mental Status: She is alert and oriented to person, place, and time.  Psychiatric:        Mood and Affect: Mood normal.        Thought Content: Thought content normal.      UC Treatments / Results  Labs (all labs ordered are listed, but only abnormal results are displayed) Labs Reviewed  POC COVID19/FLU A&B COMBO    EKG   Radiology DG Chest 2 View  Result Date: 08/23/2023 CLINICAL DATA:  42 year old female with history of fever and productive cough. Pleuritic chest pain for the past 2 days. EXAM: CHEST - 2 VIEW COMPARISON:  Chest x-ray 07/04/2021. FINDINGS: Lung volumes are normal. No consolidative airspace disease. No pleural effusions. No pneumothorax. No pulmonary nodule or mass noted. Pulmonary vasculature and the cardiomediastinal silhouette are within normal limits. IMPRESSION: No radiographic evidence of acute cardiopulmonary disease. Electronically Signed   By: Trudie Reed M.D.   On: 08/23/2023 08:47    Procedures Procedures (including critical care time)  Medications Ordered in UC Medications - No data to display  Initial Impression / Assessment and Plan / UC Course  I have reviewed the triage vital signs and the nursing notes.  Pertinent labs & imaging results that were available during my care of the patient were reviewed by me and considered in my medical decision making (see chart for details).     Mildly tachycardic in triage, otherwise vital signs within normal limits.  Exam findings consistent with viral respiratory infection.  COVID and flu testing negative in clinic, chest x-ray negative for acute cardiopulmonary abnormality.  Will treat with Phenergan DM, supportive over-the-counter medications, home care.  Return precautions reviewed.  Final Clinical Impressions(s) / UC  Diagnoses   Final diagnoses:  Viral URI with cough     Discharge Instructions      Your COVID and flu testing was negative and your chest x-ray did not show any pneumonia.  In addition to over-the-counter pain and fever reducers, you may use Flonase twice a day, cold and congestion medications, humidifiers, saline sinus rinses.  I have sent in a cough syrup additionally.  Follow-up for worsening symptoms.    ED Prescriptions     Medication Sig Dispense Auth. Provider   promethazine-dextromethorphan (PROMETHAZINE-DM) 6.25-15 MG/5ML syrup Take 5 mLs by mouth 4 (four) times daily as needed. 100 mL Particia Nearing, [place]      PDMP not reviewed this encounter.   Roosvelt Maser [place], [place] 08/23/23 [phone]

## 2023-08-28 ENCOUNTER — Ambulatory Visit
Admission: EM | Admit: 2023-08-28 | Discharge: 2023-08-28 | Disposition: A | Discharge status: Home / Self Care | Payer: Medicaid Other

## 2023-08-28 DIAGNOSIS — J22 Unspecified acute lower respiratory infection: Secondary | ICD-10-CM

## 2023-08-28 MED ORDER — PREDNISONE 20 MG PO TABS: 40.0000 mg | ORAL_TABLET | Freq: Every day | ORAL | 0 refills | Status: DC

## 2023-08-28 MED ORDER — DOXYCYCLINE HYCLATE 100 MG PO CAPS: 100.0000 mg | ORAL_CAPSULE | Freq: Two times a day (BID) | ORAL | 0 refills | Status: DC

## 2023-08-28 NOTE — ED Triage Notes (Signed)
Pt reports cough and hoarseness x 6 days  chest pain that started this morning

## 2023-08-28 NOTE — ED Provider Notes (Signed)
[facility]    CSN: [id number] Arrival date & time: 08/28/23  1137      History   Chief Complaint No chief complaint on file.   HPI Kellie Fisher is a 42 y.o. female.   Patient presenting today with about a week of hacking productive cough, hoarseness, chest pain, fatigue, wheezing.  Was seen on 08/23/2023, COVID and flu was negative and chest x-ray negative for pneumonia.  Given Phenergan DM and has been taking over-the-counter remedies additionally with no relief.  No known history of chronic pulmonary disease.  Son has been sick with similar symptoms.    Past Medical History:  Diagnosis Date   Ankle fracture    Anxiety    Daytime somnolence    Depression    Diabetes mellitus without complication (HCC)    Eczema    Migraine    PTSD (post-traumatic stress disorder)    Tumor containing calcium    Urticaria     Patient Active Problem List   Diagnosis Date Noted   Cellulitis of oral soft tissues 04/13/2017   Depression with anxiety 04/13/2017    Past Surgical History:  Procedure Laterality Date   CHOLECYSTECTOMY     GANGLION CYST EXCISION Left    HERNIA REPAIR      OB History   No obstetric history on file.      Home Medications    Prior to Admission medications   Medication Sig Start Date End Date Taking? Authorizing Provider  amoxicillin (AMOXIL) 875 MG tablet Take 875 mg by mouth 2 (two) times daily. 08/25/23  Yes [provider]  benzonatate (TESSALON) 200 MG capsule Take 200 mg by mouth 3 (three) times daily as needed. 08/27/23  Yes [provider]  doxycycline (VIBRAMYCIN) 100 MG capsule Take 1 capsule (100 mg total) by mouth 2 (two) times daily. 08/28/23  Yes Particia Nearing, PA-C  predniSONE (DELTASONE) 20 MG tablet Take 2 tablets (40 mg total) by mouth daily with breakfast. 08/28/23  Yes Particia Nearing, PA-C  SYMBICORT 160-4.5 MCG/ACT inhaler Inhale 2 puffs into the lungs. 08/27/23  Yes [provider]  acetaminophen (TYLENOL) 500 MG tablet Take 1,000 mg by mouth every 6 (six) hours as needed. For pain.    [provider]  amoxicillin-clavulanate (AUGMENTIN) 875-125 MG tablet Take 1 tablet by mouth every 12 (twelve) hours. Patient not taking: Reported on 01/22/2023 07/05/21   Roxy Horseman, PA-C  buPROPion (WELLBUTRIN XL) 300 MG 24 hr tablet TAKE 1 TABLET BY MOUTH EVERY DAY IN THE MORNING 03/06/19   [provider]  BYDUREON 2 MG PEN Inject 2 mg into the skin once a week. 02/28/17   [provider]  EMGALITY 120 MG/ML SOAJ Inject 120 mg into the skin every 30 (thirty) days. 04/01/18   [provider]  escitalopram (LEXAPRO) 20 MG tablet Take 20 mg by mouth daily. 03/30/17   [provider]  etonogestrel-ethinyl estradiol (NUVARING) 0.12-0.015 MG/24HR vaginal ring Place 1 each vaginally every 28 (twenty-eight) days. Insert vaginally and leave in place for 3 consecutive weeks, then remove for 1 week.    [provider]  famotidine (PEPCID) 40 MG tablet TAKE 1 TABLET BY MOUTH TWICE A DAY 12/06/19   Kozlow, Alvira Philips, MD  fluticasone (FLONASE) 50 MCG/ACT nasal spray SHAKE LQ AND U 2 SPRAYS IEN D 11/24/18   [provider]  gabapentin (NEURONTIN) 600 MG tablet Take by mouth. 03/04/22   [provider]  HYDROcodone-acetaminophen [facility])  10-325 MG tablet TK 1 T PO TID PRN    [provider]  lamoTRIgine (LAMICTAL) 150 MG tablet Take 150 mg by mouth at bedtime. 04/16/18   [provider]  Prenatal Vit-Fe Fumarate-FA (PRENATAL VITAMIN PLUS LOW IRON) 27-1 MG TABS Take 1 tablet by mouth daily. Patient not taking: Reported on 01/22/2023 06/04/19   [provider]  promethazine-dextromethorphan (PROMETHAZINE-DM) 6.25-15 MG/5ML syrup Take 5 mLs by mouth 4 (four) times daily as needed. 08/23/23   Particia Nearing, PA-C  rizatriptan (MAXALT-MLT) 10 MG disintegrating tablet TAKE 1 TABLET BY MOUTH AT ONSET OF  MIGRAINE, MAY REPEAT IN 2 HOURS 03/12/19   [provider]  albuterol (PROVENTIL HFA;VENTOLIN HFA) 108 (90 Base) MCG/ACT inhaler Inhale 1-2 puffs into the lungs every 6 (six) hours as needed for wheezing or shortness of breath. Patient not taking: Reported on 10/27/2019 10/17/17 04/14/20  Georgetta Haber, NP  diphenhydrAMINE (BENADRYL) 25 MG tablet Take 1 tablet (25 mg total) by mouth every 6 (six) hours. Patient not taking: Reported on 10/27/2019 04/17/19 04/14/20  Linwood Dibbles, MD    Family History Family History  Problem Relation Age of Onset   Mental illness Mother    Other Mother        reflex sympathetic dystrophy   Alcohol abuse Father    Autism Son        x2    Social History Social History   Tobacco Use   Smoking status: Never   Smokeless tobacco: Never  Vaping Use   Vaping status: Never Used  Substance Use Topics   Alcohol use: No    Comment: occasional   Drug use: No     Allergies   Azithromycin, Nsaids, Vitamin a, and Benzoyl peroxide   Review of Systems Review of Systems Per HPI  Physical Exam Triage Vital Signs ED Triage Vitals  Encounter Vitals Group     BP 08/28/23 1147 127/85     Systolic BP Percentile --      Diastolic BP Percentile --      Pulse Rate 08/28/23 1147 93     Resp 08/28/23 1147 16     Temp 08/28/23 1147 98.1 F (36.7 C)     Temp src --      SpO2 08/28/23 1147 94 %     Weight --      Height --      Head Circumference --      Peak Flow --      Pain Score 08/28/23 1148 6     Pain Loc --      Pain Education --      Exclude from Growth Chart --    No data found.  Updated Vital Signs BP 127/85 (BP Location: Right Arm)   Pulse 93   Temp 98.1 F (36.7 C)   Resp 16   SpO2 94%   Visual Acuity Right Eye Distance:   Left Eye Distance:   Bilateral Distance:    Right Eye Near:   Left Eye Near:    Bilateral Near:     Physical Exam Vitals and nursing note reviewed.  Constitutional:      Appearance: Normal appearance.  She is not ill-appearing.  HENT:     Head: Atraumatic.     Right Ear: Tympanic membrane and external ear normal.     Left Ear: Tympanic membrane and external ear normal.     Nose: Congestion present.     Mouth/Throat:  Mouth: Mucous membranes are moist.     Pharynx: Posterior oropharyngeal erythema present.  Eyes:     Extraocular Movements: Extraocular movements intact.     Conjunctiva/sclera: Conjunctivae normal.  Cardiovascular:     Rate and Rhythm: Normal rate and regular rhythm.     Heart sounds: Normal heart sounds.  Pulmonary:     Effort: Pulmonary effort is normal.     Breath sounds: Wheezing and rales present.  Musculoskeletal:        General: Normal range of motion.     Cervical back: Normal range of motion and neck supple.  Skin:    General: Skin is warm and dry.  Neurological:     Mental Status: She is alert and oriented to person, place, and time.  Psychiatric:        Mood and Affect: Mood normal.        Thought Content: Thought content normal.        Judgment: Judgment normal.      UC Treatments / Results  Labs (all labs ordered are listed, but only abnormal results are displayed) Labs Reviewed - No data to display  EKG   Radiology No results found.  Procedures Procedures (including critical care time)  Medications Ordered in UC Medications - No data to display  Initial Impression / Assessment and Plan / UC Course  I have reviewed the triage vital signs and the nursing notes.  Pertinent labs & imaging results that were available during my care of the patient were reviewed by me and considered in my medical decision making (see chart for details).     Vital signs within normal limits, exam revealing wheezes and crackles on auscultation of the lungs and given duration worsening course will treat with doxycycline, prednisone in addition to cough syrup and over-the-counter medications.  Strict return precautions reviewed.  Final Clinical  Impressions(s) / UC Diagnoses   Final diagnoses:  Lower respiratory infection   Discharge Instructions   None    ED Prescriptions     Medication Sig Dispense Auth. Provider   doxycycline (VIBRAMYCIN) 100 MG capsule Take 1 capsule (100 mg total) by mouth 2 (two) times daily. 14 capsule Particia Nearing, [place]   predniSONE (DELTASONE) 20 MG tablet Take 2 tablets (40 mg total) by mouth daily with breakfast. 10 tablet Particia Nearing, [place]      PDMP not reviewed this encounter.   Particia Nearing, [place] 08/28/23 1504

## 2024-01-12 ENCOUNTER — Other Ambulatory Visit: Payer: Self-pay | Admitting: Family Medicine

## 2024-01-12 DIAGNOSIS — Z1231 Encounter for screening mammogram for malignant neoplasm of breast: Secondary | ICD-10-CM

## 2024-01-17 ENCOUNTER — Emergency Department (HOSPITAL_COMMUNITY)

## 2024-01-17 ENCOUNTER — Other Ambulatory Visit: Payer: Self-pay

## 2024-01-17 ENCOUNTER — Encounter (HOSPITAL_COMMUNITY): Payer: Self-pay

## 2024-01-17 ENCOUNTER — Encounter: Payer: Self-pay | Admitting: Podiatry

## 2024-01-17 ENCOUNTER — Emergency Department (HOSPITAL_COMMUNITY)
Admission: EM | Admit: 2024-01-17 | Discharge: 2024-01-17 | Disposition: A | Discharge status: Home / Self Care | Attending: Emergency Medicine | Admitting: Emergency Medicine

## 2024-01-17 DIAGNOSIS — Y93H1 Activity, digging, shoveling and raking: Secondary | ICD-10-CM | POA: Insufficient documentation

## 2024-01-17 DIAGNOSIS — W228XXA Striking against or struck by other objects, initial encounter: Secondary | ICD-10-CM | POA: Diagnosis not present

## 2024-01-17 DIAGNOSIS — S91209A Unspecified open wound of unspecified toe(s) with damage to nail, initial encounter: Secondary | ICD-10-CM

## 2024-01-17 DIAGNOSIS — S91201A Unspecified open wound of right great toe with damage to nail, initial encounter: Secondary | ICD-10-CM | POA: Diagnosis not present

## 2024-01-17 DIAGNOSIS — Y92007 Garden or yard of unspecified non-institutional (private) residence as the place of occurrence of the external cause: Secondary | ICD-10-CM | POA: Diagnosis not present

## 2024-01-17 DIAGNOSIS — S99921A Unspecified injury of right foot, initial encounter: Secondary | ICD-10-CM | POA: Diagnosis present

## 2024-01-17 MED ORDER — HYDROCODONE-ACETAMINOPHEN 5-325 MG PO TABS
1.0000 | ORAL_TABLET | Freq: Once | ORAL | Status: AC
  Administered 2024-01-17: 1 via ORAL
  Filled 2024-01-17: qty 1

## 2024-01-17 MED ORDER — HYDROCODONE-ACETAMINOPHEN 5-325 MG PO TABS: 1.0000 | ORAL_TABLET | Freq: Four times a day (QID) | ORAL | 0 refills | Status: DC | PRN

## 2024-01-17 NOTE — ED Triage Notes (Signed)
 Pt is coming in for toe pain, the right greater toe is the affected toe. She was doing yard work and tripped over a shovel and it ripped her toe nail up. Unknown if the toe is broken but she can wiggle it. She is up to date on her tetanus as well.

## 2024-01-17 NOTE — ED Provider Notes (Signed)
 Kellie Fisher Provider Note   CSN: [id number] Arrival date & time: 01/17/24  2009     History {Add pertinent medical, surgical, social history, OB history to HPI:1} Chief Complaint  Patient presents with   Toe Pain    Kellie Fisher is a 43 y.o. female.  With past medical history of PTSD, migraines reporting to emergency room with complaint of room right great toe pain.  Patient reports that she was shoveling doing yard work when she hit the toenail with her shovel.  She cannot put weight on the toe.  She came out of the toe and has laceration through the nail and has some associated bleeding.  She is not on blood thinner.  And her tetanus shot is up-to-date.  No other injuries   Toe Pain       Home Medications Prior to Admission medications   Medication Sig Start Date End Date Taking? Authorizing Provider  acetaminophen  (TYLENOL ) 500 MG tablet Take 1,000 mg by mouth every 6 (six) hours as needed. For pain.    [provider]  amoxicillin  (AMOXIL ) 875 MG tablet Take 875 mg by mouth 2 (two) times daily. 08/25/23   [provider]  amoxicillin -clavulanate (AUGMENTIN ) 875-125 MG tablet Take 1 tablet by mouth every 12 (twelve) hours. Patient not taking: Reported on 01/22/2023 07/05/21   Sherel Dikes, PA-C  benzonatate  (TESSALON ) 200 MG capsule Take 200 mg by mouth 3 (three) times daily as needed. 08/27/23   [provider]  buPROPion (WELLBUTRIN XL) 300 MG 24 hr tablet TAKE 1 TABLET BY MOUTH EVERY DAY IN THE MORNING 03/06/19   [provider]  BYDUREON 2 MG PEN Inject 2 mg into the skin once a week. 02/28/17   [provider]  doxycycline  (VIBRAMYCIN ) 100 MG capsule Take 1 capsule (100 mg total) by mouth 2 (two) times daily. 08/28/23   Corbin Dess, PA-C  EMGALITY 120 MG/ML SOAJ Inject 120 mg into the skin every 30 (thirty) days. 04/01/18   [provider]  escitalopram  (LEXAPRO )  20 MG tablet Take 20 mg by mouth daily. 03/30/17   [provider]  etonogestrel-ethinyl estradiol (NUVARING) 0.12-0.015 MG/24HR vaginal ring Place 1 each vaginally every 28 (twenty-eight) days. Insert vaginally and leave in place for 3 consecutive weeks, then remove for 1 week.    [provider]  famotidine  (PEPCID ) 40 MG tablet TAKE 1 TABLET BY MOUTH TWICE A DAY 12/06/19   Kozlow, Rema Care, MD  fluticasone (FLONASE) 50 MCG/ACT nasal spray SHAKE LQ AND U 2 SPRAYS IEN D 11/24/18   [provider]  gabapentin  (NEURONTIN ) 600 MG tablet Take by mouth. 03/04/22   [provider]  HYDROcodone -acetaminophen  (NORCO) 10-325 MG tablet TK 1 T PO TID PRN    [provider]  lamoTRIgine (LAMICTAL) 150 MG tablet Take 150 mg by mouth at bedtime. 04/16/18   [provider]  predniSONE  (DELTASONE ) 20 MG tablet Take 2 tablets (40 mg total) by mouth daily with breakfast. 08/28/23   Corbin Dess, PA-C  Prenatal Vit-Fe Fumarate-FA (PRENATAL VITAMIN PLUS LOW IRON) 27-1 MG TABS Take 1 tablet by mouth daily. Patient not taking: Reported on 01/22/2023 06/04/19   [provider]  promethazine -dextromethorphan (PROMETHAZINE -DM) 6.25-15 MG/5ML syrup Take 5 mLs by mouth 4 (four) times daily as needed. 08/23/23   Corbin Dess, PA-C  rizatriptan (MAXALT-MLT) 10 MG disintegrating tablet TAKE 1 TABLET BY MOUTH AT ONSET OF MIGRAINE, MAY REPEAT IN  2 HOURS 03/12/19   [provider]  SYMBICORT 160-4.5 MCG/ACT inhaler Inhale 2 puffs into the lungs. 08/27/23   [provider]  albuterol  (PROVENTIL  HFA;VENTOLIN  HFA) 108 (90 Base) MCG/ACT inhaler Inhale 1-2 puffs into the lungs every 6 (six) hours as needed for wheezing or shortness of breath. Patient not taking: Reported on 10/27/2019 10/17/17 04/14/20  Burky, Natalie B, NP  diphenhydrAMINE  (BENADRYL ) 25 MG tablet Take 1 tablet (25 mg total) by mouth every 6 (six) hours. Patient not taking: Reported on  10/27/2019 04/17/19 04/14/20  Trish Furl, MD      Allergies    Azithromycin, Nsaids, Vitamin a, and Benzoyl peroxide    Review of Systems   Review of Systems  Skin:  Positive for wound.    Physical Exam Updated Vital Signs BP 124/77   Pulse (!) 122   Temp 98.7 F (37.1 C)   Resp 18   SpO2 100%  Physical Exam Vitals and nursing note reviewed.  Constitutional:      General: She is not in acute distress.    Appearance: She is not toxic-appearing.  HENT:     Head: Normocephalic and atraumatic.  Eyes:     General: No scleral icterus.    Conjunctiva/sclera: Conjunctivae normal.  Cardiovascular:     Rate and Rhythm: Normal rate and regular rhythm.     Pulses: Normal pulses.     Heart sounds: Normal heart sounds.  Pulmonary:     Effort: Pulmonary effort is normal. No respiratory distress.     Breath sounds: Normal breath sounds.  Abdominal:     General: Abdomen is flat. Bowel sounds are normal.     Palpations: Abdomen is soft.     Tenderness: There is no abdominal tenderness.  Musculoskeletal:     Right lower leg: No edema.     Left lower leg: No edema.  Skin:    General: Skin is warm and dry.     Findings: Lesion present.     Comments: Great toe 1/2 distal aspect of nail avulsed   Neurological:     General: No focal deficit present.     Mental Status: She is alert and oriented to person, place, and time. Mental status is at baseline.        ED Results / Procedures / Treatments   Labs (all labs ordered are listed, but only abnormal results are displayed) Labs Reviewed - No data to display  EKG None  Radiology No results found.  Procedures Procedures  {Document cardiac monitor, telemetry assessment procedure when appropriate:1}  Medications Ordered in ED Medications  HYDROcodone -acetaminophen  (NORCO/VICODIN) 5-325 MG per tablet 1 tablet (has no administration in time range)    ED Course/ Medical Decision Making/ A&P   {   Click here for ABCD2, HEART  and other calculatorsREFRESH Note before signing :1}                              Medical Decision Making Amount and/or Complexity of Data Reviewed Radiology: ordered.  Risk Prescription drug management.   Kellie Fisher 43 y.o. presented today for a 1 cm laceration to their great toe and nail. Working Ddx: FB, fracture, NV compromise, simple laceration.  R/o DDx: These ddx are considered less likely due to history of present illness and physical exam findings.  Pmhx considered:   Patient presented for a 1 cm laceration to their great toe and nail. They are neurovascularly  intact. Tetanus is UTD. Patient is in no distress. Laceration will be repaired with standard wound care procedures and antibiotic ointment.    Imaging: X-ray great toe -- negative for acute fracture     Problem List / ED Course / Critical interventions / Medication management  Reporting to emergency room with laceration of the right great toe.  She is hemodynamically stable and well-appearing.  She is not on blood thinner.  She has damage to the nail itself which.  She overall is not the skin.  Bleeding is well-controlled.  Patient is able to wiggle her toes and has no other associated injury or pain.  She has strong dorsal pedal pulse.  She is neurovascularly intact.  Discussed taping toe nail down with Peri-Strips and digit block. Patient declined, dose not want toe nail Steri-Stripped.  I do not want to remove toenail as there is skin that has avulsed away from the toenail as well.  There is no involvement of the nailbed itself.  No area needing obvious sutures.  Bleeding controlled with direct pressure and wrapped.  I did cover the area with Xeroform gauze.  Recommended wearing close toed shoes if possible to protect the area from further trauma.  Given otherwise reassuring workup we will refer her to podiatry.  She reports she tried to schedule an appointment and they are 3 weeks out ambulatory referral sent and  she understands this may still take some time.  I ordered medication including Norco for pain Reevaluation of the patient after these medicines showed that the patient improved Patients vitals assessed. Upon arrival patient is hemodynamically stable.  I have reviewed the patients home medicines and have made adjustments as needed   Plan: Patient is stable for discharge at this time. Patient/family expressed understanding of return precautions and need for follow-up.  Keep wound clean, covered. Educated on sx of concern regarding complications -- such as infection, poor wound healing, compartment sx.    {Document critical care time when appropriate:1} {Document review of labs and clinical decision tools ie heart score, Chads2Vasc2 etc:1}  {Document your independent review of radiology images, and any outside records:1} {Document your discussion with family members, caretakers, and with consultants:1} {Document social determinants of health affecting pt's care:1} {Document your decision making why or why not admission, treatments were needed:1} Final Clinical Impression(s) / ED Diagnoses Final diagnoses:  None    Rx / DC Orders ED Discharge Orders     None

## 2024-01-17 NOTE — ED Notes (Signed)
 Toe cleaned. Toenail is split. Intermittently bleeding.

## 2024-01-17 NOTE — Discharge Instructions (Signed)
 Keep area clean, dry and covered.  Wash daily with gentle soap and water.  You can also apply Neosporin or bacitracin once a morning once in the evening.  Please call to try and find able to schedule follow-up with podiatry.

## 2024-01-19 ENCOUNTER — Ambulatory Visit: Admitting: Podiatry

## 2024-01-19 ENCOUNTER — Encounter: Payer: Self-pay | Admitting: Podiatry

## 2024-01-19 DIAGNOSIS — S99921A Unspecified injury of right foot, initial encounter: Secondary | ICD-10-CM | POA: Diagnosis not present

## 2024-01-19 DIAGNOSIS — S99929A Unspecified injury of unspecified foot, initial encounter: Secondary | ICD-10-CM

## 2024-01-19 DIAGNOSIS — L0889 Other specified local infections of the skin and subcutaneous tissue: Secondary | ICD-10-CM | POA: Diagnosis not present

## 2024-01-19 DIAGNOSIS — S90111A Contusion of right great toe without damage to nail, initial encounter: Secondary | ICD-10-CM | POA: Diagnosis not present

## 2024-01-19 MED ORDER — DOXYCYCLINE HYCLATE 100 MG PO TABS: 100.0000 mg | ORAL_TABLET | Freq: Two times a day (BID) | ORAL | 0 refills | Status: DC

## 2024-01-22 ENCOUNTER — Telehealth: Payer: Self-pay | Admitting: Podiatry

## 2024-01-22 ENCOUNTER — Encounter: Payer: Self-pay | Admitting: Podiatry

## 2024-01-22 NOTE — Telephone Encounter (Signed)
 Patient mother called stating she had her toe nail removed last week. Mom states it looks so bad, she thinks she is on antibiotics but she wants to be seen today or tomorrow. No appointments in [place] or [facility] available. Let mom know I would send message to Dr Luster Salters regarding her concerns.

## 2024-01-22 NOTE — Progress Notes (Signed)
 Chief Complaint  Patient presents with   Toe Injury    "I had a shovel to the toe on Friday.  I went to [facility].  You might have to take the toenail off." N - toenail  L - left hallux D - Friday O - suddenly, hit it with the shovel C - sharp pain, throbbing A - walking, standing, pressure, shoes T - [place] - xrays, dressing, Xeroform     HPI: 43 y.o. female presenting today for new complaint of an injury that was sustained to the right hallux nail plate on Friday, 01/16/2024.  Patient states that she was working in the yard in her sandals when she hit a shovel blade with the left great toe.  She went to the emergency department and was evaluated and referred here for urgent work in.  Presenting for further treatment evaluation  Past Medical History:  Diagnosis Date   Ankle fracture    Anxiety    Daytime somnolence    Depression    Diabetes mellitus without complication (HCC)    Eczema    Migraine    PTSD (post-traumatic stress disorder)    Tumor containing calcium    Urticaria     Past Surgical History:  Procedure Laterality Date   CHOLECYSTECTOMY     GANGLION CYST EXCISION Left    HERNIA REPAIR      Allergies  Allergen Reactions   Azithromycin Other (See Comments)    Makes her crazy    Nsaids Anaphylaxis   Vitamin A Other (See Comments)    Sunburn Feel/Swelling   Benzoyl Peroxide      Physical Exam: General: The patient is alert and oriented x3 in no acute distress.  Dermatology: Traumatic injury noted to the left hallux nail plate.  Subungual hematoma noted.  The nail plate is loosely adhered.  There is no purulence or surrounding erythema that would be concerning clinically for acute cellulitis or infection  Vascular: Palpable pedal pulses bilaterally. Capillary refill within normal limits.  No appreciable edema.  No erythema.  Neurological: Grossly intact via light touch  Musculoskeletal Exam: No pedal deformities noted  DG Toe Great Right  01/17/2024 FINDINGS: There is no evidence of fracture or dislocation. There is no evidence of arthropathy or other focal bone abnormality. Toenail appears partially avulsed. Soft tissues are otherwise unremarkable.   IMPRESSION: 1. No fracture or dislocation. Toenail appears partially avulsed.  Assessment/Plan of Care: 1.  Traumatic injury to the right hallux nail plate  -Patient evaluated -After evaluating the toe I do believe that nail avulsion is appropriate to remove the nail plate and allow new nail to grow in.  This was discussed in detail with the patient she agrees and consents -The toe was prepped in aseptic manner and digital block performed using 3 mL of 2% lidocaine plain -The nail was avulsed in its entirety and dressings applied.  Post care instructions provided -Prescription for doxycycline  100 mg twice daily x 10 days -Return to clinic 3 weeks     Dot Gazella, DPM [facility]  Dr. Dot Gazella, DPM    [address], [place] [address]  Office [phone]  Fax [phone]

## 2024-07-08 NOTE — Therapy (Unsigned)
 OUTPATIENT PHYSICAL THERAPY FEMALE PELVIC EVALUATION   Patient Name: Kellie Fisher MRN: [id number] DOB:1981-07-13, 43 y.o., female Today's Date: 07/09/2024  END OF SESSION:  PT End of Session - 07/09/24 0836     Visit Number 1    Date for Recertification  01/07/25    Authorization Type [employer]    PT Start Time 0830    PT Stop Time 0915    PT Time Calculation (min) 45 min    Activity Tolerance Patient tolerated treatment well    Behavior During Therapy [facility] for tasks assessed/performed          Past Medical History:  Diagnosis Date   Ankle fracture    Anxiety    Daytime somnolence    Depression    Diabetes mellitus without complication (HCC)    Eczema    Migraine    PTSD (post-traumatic stress disorder)    Tumor containing calcium    Urticaria    Past Surgical History:  Procedure Laterality Date   CHOLECYSTECTOMY     GANGLION CYST EXCISION Left    HERNIA REPAIR     Patient Active Problem List   Diagnosis Date Noted   Cellulitis of oral soft tissues 04/13/2017   Depression with anxiety 04/13/2017    PCP: Burney Darice CROME, MD  REFERRING PROVIDER: Katharina Pellet, MD  REFERRING DIAG: K59.01 (ICD-10-CM) - Slow transit constipation   THERAPY DIAG:  Other lack of coordination  Abdominal pain, unspecified abdominal location  Abnormal posture  Rationale for Evaluation and Treatment: Rehabilitation  ONSET DATE: 2020  SUBJECTIVE:                                                                                                                                                                                           SUBJECTIVE STATEMENT: Constipation issues for years. Patient will have surgery to correct prolapse, reattach muscles to colon and place it back Fluid intake: water, ginger ale, tea, red bull  FUNCTIONAL LIMITATIONS: none  PERTINENT HISTORY:  Medications for current condition: none Surgeries: Cholecystectomy; Hernia repair Other:  Diabetes, PTSD Sexual abuse: No  PAIN:  Are you having pain? Yes NPRS scale: 8/10 Pain location: starts at the left lower quadrant then goes to the left  Pain type: sharp and stabbing Pain description: intermittent   Aggravating factors: happens when she is constipated; sharp pain happens when she has to go to the bathroom Relieving factors: having a bowel movement and happens 1-2 hours after bowel movement  PRECAUTIONS: None  RED FLAGS: None   WEIGHT BEARING RESTRICTIONS: No  FALLS:  Has patient fallen in last 6 months? No  OCCUPATION: not working  ACTIVITY LEVEL : 2-3 times per week with walk or elliptical  PLOF: Independent  PATIENT GOALS: need therapy prior to surgery; learn how to have a bowel movement   BOWEL MOVEMENT: Pain with bowel movement: Yes Once she gets the urge to have a bowel movement she has to hurry up to the commode; patient has to lean back to have a bowel movement Type of bowel movement:Type (Bristol Stool Scale) Type 1,2, 6, 7, Frequency tries to have a bowel movement daily, Strain yes, and Splinting hold her abdomen and 2 times had to press on the vaginal opening Fully empty rectum: Yes: but has to sit for 30 minutes or more and after she eats Leakage: Yes: 3 accidents in last 5 years                                                     Caused by: when has diarrhea Pads: No Fiber supplement/laxative exlax every other day  URINATION: Pain with urination: No Fully empty bladder: Yes: wait then try again to fully empty                                Post-void dribble: No Stream: Strong Urgency: No Frequency:during the day 3-4 times                                                         Nocturia: Yes:  4 times or more depending on her sugar levels   Leakage: Coughing, Sneezing, and Laughing Pads/briefs: Yes: 1 pad  INTERCOURSE:not sexually active  Ability to have vaginal penetration Yes  not painful in the past  PREGNANCY: Vaginal deliveries  2 Tearing Yes: tore with both; took 30 minutes to sew her up   PROLAPSE: Bulge   OBJECTIVE:  Note: Objective measures were completed at Evaluation unless otherwise noted.  DIAGNOSTIC FINDINGS:  none  PATIENT SURVEYS:  PFIQ-7: 44 UIQ-7: 0 CRAIQ-7: 52  COGNITION: Overall cognitive status: Within functional limits for tasks assessed     SENSATION: Light touch: Appears intact    FUNCTIONAL TESTS:  Single leg stance:  Rt: drop of left hip  Ou:imne of right hip   GAIT: Assistive device utilized: None  POSTURE: rounded shoulders, forward head, and decreased lumbar lordosis   LUMBARAROM/PROM:  A/PROM A/PROM  Eval (% available)  Flexion WFL with fascial tightness in the lumbar  Extension 75  Right lateral flexion 75  Left lateral flexion 75  Right rotation 100  Left rotation 100   (Blank rows = not tested)  LOWER EXTREMITY ROM: bilateral hip ROM WFL   LOWER EXTREMITY MMT: bilateral hip strength is 5/5  PALPATION: Abdominal: raises chest to contract the abdomen  Diastasis: No Distortion: No  Breathing: difficulty with expanding the lower rib cage Scar tissue: No                External Perineal Exam: redness along the labia and vulva area; decreased mobility of the perineal body  Internal Pelvic Floor: going through the rectum the patient was not able to push the therapist finger out of the rectum instead she will contract the muscles  Patient confirms identification and approves PT to assess internal pelvic floor and treatment Yes No emotional/communication barriers or cognitive limitation. Patient is motivated to learn. Patient understands and agrees with treatment goals and plan. PT explains patient will be examined in standing, sitting, and lying down to see how their muscles and joints work. When they are ready, they will be asked to remove their underwear so PT can examine their perineum. The patient is also given the option  of providing their own chaperone as one is not provided in our facility. The patient also has the right and is explained the right to defer or refuse any part of the evaluation or treatment including the internal exam. With the patient's consent, PT will use one gloved finger to gently assess the muscles of the pelvic floor, seeing how well it contracts and relaxes and if there is muscle symmetry. After, the patient will get dressed and PT and patient will discuss exam findings and plan of care. PT and patient discuss plan of care, schedule, attendance policy and HEP activities.   PELVIC MMT:   MMT eval  Vaginal 4/5  Internal Anal Sphincter 3/5  External Anal Sphincter 1/5  Puborectalis 4/5  Diastasis Recti none  (Blank rows = not tested)          TODAY'S TREATMENT:                                                                                                                              DATE: 07/09/24  EVAL Examination completed, findings reviewed, pt educated on POC, HEP, and female pelvic floor anatomy, reasoning with pelvic floor assessment internally with pt consent, and abdominal massage. Pt motivated to participate in PT and agreeable to attempt recommendations.     PATIENT EDUCATION:  07/09/24 Education details: Access Code: [email or web address] Person educated: Patient Education method: Explanation, Demonstration, Tactile cues, Verbal cues, and Handouts Education comprehension: verbalized understanding, returned demonstration, verbal cues required, tactile cues required, and needs further education  HOME EXERCISE PROGRAM: 07/09/24 Access Code: [email or web address] URL: [email or web address] Date: 07/09/2024 Prepared by: Channing Pereyra  Exercises - Hooklying Transversus Abdominis Palpation  - 1 x daily - 7 x weekly - 1 sets - 10 reps - Seated Diaphragmatic Breathing  - 1 x daily - 7 x weekly - 1 sets - 10 reps  Patient Education - Abdominal Massage for Constipation - Abdominal  Massage for Constipation For all possible CPT codes, reference the Planned Interventions line above.     Check all conditions that are expected to impact treatment: {Conditions expected to impact treatment:Unknown   If treatment provided at initial evaluation, no treatment charged due to lack of authorization.       ASSESSMENT:  CLINICAL IMPRESSION: Patient is a 43 y.o. female who was seen today for  physical therapy evaluation and treatment for slow transit constipation. Patient has had issues with constipation for 5 years. She has lower abdominal pain that starts on the left and will move to the right at level 8/10 when she is constipated. Patient tries to have a bowel movement daily and if she is not able to she will take an Exlax.  She has weakness in the EAS, IAS and puborectalis. She will contract the pelvic floor instead of relax when trying to push therapist gloved finger out of the rectum. She has decreased movement of the lower rib cage. She has stool urgency and has to rush to the commode. She has to lift her abdomen to have a bowel movement and lean backward. She has difficulty with contracting her abdomen. Patient will benefit from skilled therapy to improve coordination of pushing the stool out and reducing lower abdominal pain.   OBJECTIVE IMPAIRMENTS: decreased coordination, decreased ROM, decreased strength, increased fascial restrictions, postural dysfunction, and pain.   ACTIVITY LIMITATIONS: continence and toileting  PARTICIPATION LIMITATIONS: shopping and community activity  PERSONAL FACTORS: 1-2 comorbidities: Cholecystectomy; Hernia repair, diabetes, PTSD are also affecting patient's functional outcome.   REHAB POTENTIAL: Excellent  CLINICAL DECISION MAKING: Evolving/moderate complexity  EVALUATION COMPLEXITY: Moderate   GOALS: Goals reviewed with patient? Yes  SHORT TERM GOALS: Target date: 06/11/24  Patient is able to perform diaphragmatic breathing to relax  the pelvic floor.  Baseline: Goal status: INITIAL  2.  Patient is able to expand her lower rib cage to assist with diaphragmatic breathing.  Baseline:  Goal status: INITIAL  3.  Patient is independent with initial HEP for core strength.  Baseline:  Goal status: INITIAL  4.  Patient educated on abdominal massage to assist with peristalic motion of the intestines.  Baseline:  Goal status: INITIAL   LONG TERM GOALS: Target date: 01/08/24  Patient independent with advanced HEP for core and pelvic floor exercises.  Baseline:  Goal status: INITIAL  2.  Patient is able to push therapist finger out of the rectum due to the ability to relax the pelvic floor and generate pressure to push the finger out.  Baseline:  Goal status: INITIAL  3.  Patient reports her lower abdominal pain decreased >/= 4/10 due to performing manual work to her abdomen and relaxing the pelvic floor.  Baseline:  Goal status: INITIAL   PLAN:  PT FREQUENCY: 1-2x/week  PT DURATION: 6 months  PLANNED INTERVENTIONS: 97110-Therapeutic exercises, 97530- Therapeutic activity, 97112- Neuromuscular re-education, 97535- Self Care, 02859- Manual therapy, G0283- Electrical stimulation (unattended), 20560 (1-2 muscles), 20561 (3+ muscles)- Dry Needling, Patient/Family education, Joint mobilization, Spinal mobilization, Cryotherapy, Moist heat, and Biofeedback  PLAN FOR NEXT SESSION: abdominal massage, rib opening, diaphragmatic breathing   Channing Pereyra, PT 07/09/24 9:51 AM

## 2024-07-09 ENCOUNTER — Ambulatory Visit: Attending: Obstetrics and Gynecology | Admitting: Physical Therapy

## 2024-07-09 ENCOUNTER — Other Ambulatory Visit: Payer: Self-pay

## 2024-07-09 ENCOUNTER — Encounter: Payer: Self-pay | Admitting: Physical Therapy

## 2024-07-09 DIAGNOSIS — R109 Unspecified abdominal pain: Secondary | ICD-10-CM | POA: Insufficient documentation

## 2024-07-09 DIAGNOSIS — R293 Abnormal posture: Secondary | ICD-10-CM | POA: Insufficient documentation

## 2024-07-09 DIAGNOSIS — R278 Other lack of coordination: Secondary | ICD-10-CM | POA: Diagnosis present

## 2024-07-13 ENCOUNTER — Encounter: Payer: Self-pay | Admitting: Physical Therapy

## 2024-07-13 ENCOUNTER — Encounter: Attending: Obstetrics and Gynecology | Admitting: Physical Therapy

## 2024-07-13 DIAGNOSIS — R109 Unspecified abdominal pain: Secondary | ICD-10-CM | POA: Insufficient documentation

## 2024-07-13 DIAGNOSIS — R278 Other lack of coordination: Secondary | ICD-10-CM | POA: Diagnosis present

## 2024-07-13 DIAGNOSIS — R293 Abnormal posture: Secondary | ICD-10-CM | POA: Insufficient documentation

## 2024-07-13 NOTE — Therapy (Signed)
 OUTPATIENT PHYSICAL THERAPY FEMALE PELVIC TREATMENT   Patient Name: Kellie Fisher MRN: [id number] DOB:1981-07-12, 43 y.o., female Today's Date: 07/13/2024  END OF SESSION:  PT End of Session - 07/13/24 1406     Visit Number 2    Date for Recertification  01/07/25    Authorization Type [employer]    Authorization Time Period 10/17-11/15    Authorization - Visit Number 1    Authorization - Number of Visits 7    PT Start Time 1400    PT Stop Time 1445    PT Time Calculation (min) 45 min    Activity Tolerance Patient tolerated treatment well    Behavior During Therapy WFL for tasks assessed/performed          Past Medical History:  Diagnosis Date   Ankle fracture    Anxiety    Daytime somnolence    Depression    Diabetes mellitus without complication (HCC)    Eczema    Migraine    PTSD (post-traumatic stress disorder)    Tumor containing calcium    Urticaria    Past Surgical History:  Procedure Laterality Date   CHOLECYSTECTOMY     GANGLION CYST EXCISION Left    HERNIA REPAIR     Patient Active Problem List   Diagnosis Date Noted   Cellulitis of oral soft tissues 04/13/2017   Depression with anxiety 04/13/2017    PCP: Burney Darice CROME, MD  REFERRING PROVIDER: Katharina Pellet, MD  REFERRING DIAG: K59.01 (ICD-10-CM) - Slow transit constipation   THERAPY DIAG:  Other lack of coordination  Abdominal pain, unspecified abdominal location  Abnormal posture  Rationale for Evaluation and Treatment: Rehabilitation  ONSET DATE: 2020  SUBJECTIVE:                                                                                                                                                                                           SUBJECTIVE STATEMENT:  Fluid intake: water, ginger ale, tea, red bull  FUNCTIONAL LIMITATIONS: none  PERTINENT HISTORY:  Medications for current condition: none Surgeries: Cholecystectomy; Hernia repair Other:  Diabetes, PTSD Sexual abuse: No  PAIN:  Are you having pain? Yes NPRS scale: 8/10 Pain location: starts at the left lower quadrant then goes to the left  Pain type: sharp and stabbing Pain description: intermittent   Aggravating factors: happens when she is constipated; sharp pain happens when she has to go to the bathroom Relieving factors: having a bowel movement and happens 1-2 hours after bowel movement  PRECAUTIONS: None  RED FLAGS: None   WEIGHT BEARING RESTRICTIONS: No  FALLS:  Has patient fallen  in last 6 months? No  OCCUPATION: not working  ACTIVITY LEVEL : 2-3 times per week with walk or elliptical  PLOF: Independent  PATIENT GOALS: need therapy prior to surgery; learn how to have a bowel movement   BOWEL MOVEMENT: Pain with bowel movement: Yes Once she gets the urge to have a bowel movement she has to hurry up to the commode; patient has to lean back to have a bowel movement Type of bowel movement:Type (Bristol Stool Scale) Type 1,2, 6, 7, Frequency tries to have a bowel movement daily, Strain yes, and Splinting hold her abdomen and 2 times had to press on the vaginal opening Fully empty rectum: Yes: but has to sit for 30 minutes or more and after she eats Leakage: Yes: 3 accidents in last 5 years                                                     Caused by: when has diarrhea Pads: No Fiber supplement/laxative exlax every other day  URINATION: Pain with urination: No Fully empty bladder: Yes: wait then try again to fully empty                                Post-void dribble: No Stream: Strong Urgency: No Frequency:during the day 3-4 times                                                         Nocturia: Yes:  4 times or more depending on her sugar levels   Leakage: Coughing, Sneezing, and Laughing Pads/briefs: Yes: 1 pad  INTERCOURSE:not sexually active  Ability to have vaginal penetration Yes  not painful in the past  PREGNANCY: Vaginal deliveries  2 Tearing Yes: tore with both; took 30 minutes to sew her up   PROLAPSE: Bulge   OBJECTIVE:  Note: Objective measures were completed at Evaluation unless otherwise noted.  DIAGNOSTIC FINDINGS:  none  PATIENT SURVEYS:  PFIQ-7: 44 UIQ-7: 0 CRAIQ-7: 52  COGNITION: Overall cognitive status: Within functional limits for tasks assessed     SENSATION: Light touch: Appears intact    FUNCTIONAL TESTS:  Single leg stance:  Rt: drop of left hip  Ou:imne of right hip   GAIT: Assistive device utilized: None  POSTURE: rounded shoulders, forward head, and decreased lumbar lordosis   LUMBARAROM/PROM:  A/PROM A/PROM  Eval (% available)  Flexion WFL with fascial tightness in the lumbar  Extension 75  Right lateral flexion 75  Left lateral flexion 75  Right rotation 100  Left rotation 100   (Blank rows = not tested)  LOWER EXTREMITY ROM: bilateral hip ROM WFL   LOWER EXTREMITY MMT: bilateral hip strength is 5/5  PALPATION: Abdominal: raises chest to contract the abdomen  Diastasis: No Distortion: No  Breathing: difficulty with expanding the lower rib cage Scar tissue: No                External Perineal Exam: redness along the labia and vulva area; decreased mobility of the perineal body  Internal Pelvic Floor: going through the rectum the patient was not able to push the therapist finger out of the rectum instead she will contract the muscles  Patient confirms identification and approves PT to assess internal pelvic floor and treatment Yes No emotional/communication barriers or cognitive limitation. Patient is motivated to learn. Patient understands and agrees with treatment goals and plan. PT explains patient will be examined in standing, sitting, and lying down to see how their muscles and joints work. When they are ready, they will be asked to remove their underwear so PT can examine their perineum. The patient is also given the option  of providing their own chaperone as one is not provided in our facility. The patient also has the right and is explained the right to defer or refuse any part of the evaluation or treatment including the internal exam. With the patient's consent, PT will use one gloved finger to gently assess the muscles of the pelvic floor, seeing how well it contracts and relaxes and if there is muscle symmetry. After, the patient will get dressed and PT and patient will discuss exam findings and plan of care. PT and patient discuss plan of care, schedule, attendance policy and HEP activities.   PELVIC MMT:   MMT eval  Vaginal 4/5  Internal Anal Sphincter 3/5  External Anal Sphincter 1/5  Puborectalis 4/5  Diastasis Recti none  (Blank rows = not tested)          TODAY'S TREATMENT:    07/13/24 Manual: Soft tissue mobilization: Circular massage to the abdomen and the I Love you massage to promote peristalic motion of the intestines and improve movement of the stool Manual work to the diaphragm to prepare for diaphragmatic breathing.  Myofascial release: Release of the lateral abdomen to assist with the stool going from the descending colon to the colon Release of the right lower abdomen to release restrictions around the ascending colon and its attachment at the small intestines.  Neuromuscular re-education: Form correction: Laying on side with yoga block under the lower rib cage working on rib expansion bil.  Karolynn pose with breathing into the posterior lower rib cage then placed arms on yoga block to extend thoracic spine Down training: Sitting and supine working on diaphragmatic breathing to open up the pelvic floor Therapeutic activities: Functional strengthening activities: Educated patient on sitting on commode with knees above the hips, diaphragmatic breathing to relax the pelvic floor and how to breath to generate force to push stool out. Patient was able to return demonstration                                                                                                                               DATE: 07/09/24  EVAL Examination completed, findings reviewed, pt educated on POC, HEP, and female pelvic floor anatomy, reasoning with pelvic floor assessment internally with pt consent, and abdominal massage. Pt motivated to participate in PT and agreeable to  attempt recommendations.     PATIENT EDUCATION:  07/09/24 Education details: Access Code: [email or web address] Person educated: Patient Education method: Explanation, Demonstration, Tactile cues, Verbal cues, and Handouts Education comprehension: verbalized understanding, returned demonstration, verbal cues required, tactile cues required, and needs further education  HOME EXERCISE PROGRAM: 07/09/24 Access Code: [email or web address] URL: [email or web address] Date: 07/09/2024 Prepared by: Channing Pereyra  Exercises - Hooklying Transversus Abdominis Palpation  - 1 x daily - 7 x weekly - 1 sets - 10 reps - Seated Diaphragmatic Breathing  - 1 x daily - 7 x weekly - 1 sets - 10 reps  Patient Education - Abdominal Massage for Constipation - Abdominal Massage for Constipation       ASSESSMENT:  CLINICAL IMPRESSION: Patient is a 43 y.o. female who was seen today for physical therapy  treatment for slow transit constipation. Patient was able to feel pressure in the rectum while pushing stool out. She has increased lower rib cage mobility to work on diaphragmatic breathing. She understands how to perform abdominal massage. She understands how to sit on commode to have a bowel movement. Patient will benefit from skilled therapy to improve coordination of pushing the stool out and reducing lower abdominal pain.   OBJECTIVE IMPAIRMENTS: decreased coordination, decreased ROM, decreased strength, increased fascial restrictions, postural dysfunction, and pain.   ACTIVITY LIMITATIONS: continence and toileting  PARTICIPATION LIMITATIONS:  shopping and community activity  PERSONAL FACTORS: 1-2 comorbidities: Cholecystectomy; Hernia repair, diabetes, PTSD are also affecting patient's functional outcome.   REHAB POTENTIAL: Excellent  CLINICAL DECISION MAKING: Evolving/moderate complexity  EVALUATION COMPLEXITY: Moderate   GOALS: Goals reviewed with patient? Yes  SHORT TERM GOALS: Target date: 06/11/24  Patient is able to perform diaphragmatic breathing to relax the pelvic floor.  Baseline: Goal status: Met 07/13/24  2.  Patient is able to expand her lower rib cage to assist with diaphragmatic breathing.  Baseline:  Goal status: INITIAL  3.  Patient is independent with initial HEP for core strength.  Baseline:  Goal status: INITIAL  4.  Patient educated on abdominal massage to assist with peristalic motion of the intestines.  Baseline:  Goal status: Met 07/13/24   LONG TERM GOALS: Target date: 01/08/24  Patient independent with advanced HEP for core and pelvic floor exercises.  Baseline:  Goal status: INITIAL  2.  Patient is able to push therapist finger out of the rectum due to the ability to relax the pelvic floor and generate pressure to push the finger out.  Baseline:  Goal status: INITIAL  3.  Patient reports her lower abdominal pain decreased >/= 4/10 due to performing manual work to her abdomen and relaxing the pelvic floor.  Baseline:  Goal status: INITIAL   PLAN:  PT FREQUENCY: 1-2x/week  PT DURATION: 6 months  PLANNED INTERVENTIONS: 97110-Therapeutic exercises, 97530- Therapeutic activity, [id number]- Neuromuscular re-education, 97535- Self Care, 02859- Manual therapy, G0283- Electrical stimulation (unattended), 20560 (1-2 muscles), 20561 (3+ muscles)- Dry Needling, Patient/Family education, Joint mobilization, Spinal mobilization, Cryotherapy, Moist heat, and Biofeedback  PLAN FOR NEXT SESSION: abdominal strength,    Channing Pereyra, PT 07/13/24 3:03 PM

## 2024-07-13 NOTE — Patient Instructions (Signed)

## 2024-07-20 ENCOUNTER — Encounter: Admitting: Physical Therapy

## 2024-07-27 ENCOUNTER — Encounter: Attending: Obstetrics and Gynecology | Admitting: Physical Therapy

## 2024-07-27 ENCOUNTER — Encounter: Payer: Self-pay | Admitting: Physical Therapy

## 2024-07-27 DIAGNOSIS — R278 Other lack of coordination: Secondary | ICD-10-CM | POA: Insufficient documentation

## 2024-07-27 DIAGNOSIS — R293 Abnormal posture: Secondary | ICD-10-CM | POA: Insufficient documentation

## 2024-07-27 DIAGNOSIS — R109 Unspecified abdominal pain: Secondary | ICD-10-CM | POA: Diagnosis present

## 2024-07-27 NOTE — Therapy (Signed)
 OUTPATIENT PHYSICAL THERAPY FEMALE PELVIC TREATMENT   Patient Name: Kellie Fisher MRN: [id number] DOB:Feb 14, 1981, 43 y.o., female Today's Date: 07/27/2024  END OF SESSION:  PT End of Session - 07/27/24 1405     Visit Number 3    Date for Recertification  01/07/25    Authorization Type [employer]    Authorization Time Period 10/17-11/15    Authorization - Visit Number 2    Authorization - Number of Visits 7    PT Start Time 1400    PT Stop Time 1445    PT Time Calculation (min) 45 min    Activity Tolerance Patient tolerated treatment well    Behavior During Therapy WFL for tasks assessed/performed          Past Medical History:  Diagnosis Date   Ankle fracture    Anxiety    Daytime somnolence    Depression    Diabetes mellitus without complication (HCC)    Eczema    Migraine    PTSD (post-traumatic stress disorder)    Tumor containing calcium    Urticaria    Past Surgical History:  Procedure Laterality Date   CHOLECYSTECTOMY     GANGLION CYST EXCISION Left    HERNIA REPAIR     Patient Active Problem List   Diagnosis Date Noted   Cellulitis of oral soft tissues 04/13/2017   Depression with anxiety 04/13/2017    PCP: Burney Darice CROME, MD  REFERRING PROVIDER: Katharina Pellet, MD  REFERRING DIAG: K59.01 (ICD-10-CM) - Slow transit constipation   THERAPY DIAG:  Other lack of coordination  Abdominal pain, unspecified abdominal location  Abnormal posture  Rationale for Evaluation and Treatment: Rehabilitation  ONSET DATE: 2020  SUBJECTIVE:                                                                                                                                                                                           SUBJECTIVE STATEMENT: I have tried the breathing to push the stool out and not sure if it has helped.  Fluid intake: water, ginger ale, tea, red bull  FUNCTIONAL LIMITATIONS: none  PERTINENT HISTORY:  Medications for  current condition: none Surgeries: Cholecystectomy; Hernia repair Other: Diabetes, PTSD Sexual abuse: No  PAIN:  Are you having pain? Yes NPRS scale: 8/10 07/27/24; 6/10 pain level Pain location: starts at the left lower quadrant then goes to the left  Pain type: sharp and stabbing Pain description: intermittent   Aggravating factors: happens when she is constipated; sharp pain happens when she has to go to the bathroom Relieving factors: having a bowel movement and happens 1-2 hours after  bowel movement  PRECAUTIONS: None  RED FLAGS: None   WEIGHT BEARING RESTRICTIONS: No  FALLS:  Has patient fallen in last 6 months? No  OCCUPATION: not working  ACTIVITY LEVEL : 2-3 times per week with walk or elliptical  PLOF: Independent  PATIENT GOALS: need therapy prior to surgery; learn how to have a bowel movement   BOWEL MOVEMENT: Pain with bowel movement: Yes Once she gets the urge to have a bowel movement she has to hurry up to the commode; patient has to lean back to have a bowel movement Type of bowel movement:Type (Bristol Stool Scale) Type 1,2, 6, 7, Frequency tries to have a bowel movement daily, Strain yes, and Splinting hold her abdomen and 2 times had to press on the vaginal opening Fully empty rectum: Yes: but has to sit for 30 minutes or more and after she eats Leakage: Yes: 3 accidents in last 5 years                                                     Caused by: when has diarrhea Pads: No Fiber supplement/laxative exlax every other day  URINATION: Pain with urination: No Fully empty bladder: Yes: wait then try again to fully empty                                Post-void dribble: No Stream: Strong Urgency: No Frequency:during the day 3-4 times                                                         Nocturia: Yes:  4 times or more depending on her sugar levels   Leakage: Coughing, Sneezing, and Laughing Pads/briefs: Yes: 1 pad  INTERCOURSE:not sexually  active  Ability to have vaginal penetration Yes  not painful in the past  PREGNANCY: Vaginal deliveries 2 Tearing Yes: tore with both; took 30 minutes to sew her up   PROLAPSE: Bulge   OBJECTIVE:  Note: Objective measures were completed at Evaluation unless otherwise noted.  DIAGNOSTIC FINDINGS:  none  PATIENT SURVEYS:  PFIQ-7: 44 UIQ-7: 0 CRAIQ-7: 52 07/27/24: PFIQ-7: 57 UIQ-7: 24 CRAIQ-7: 43  COGNITION: Overall cognitive status: Within functional limits for tasks assessed     SENSATION: Light touch: Appears intact    FUNCTIONAL TESTS:  Single leg stance:  Rt: drop of left hip  Ou:imne of right hip   GAIT: Assistive device utilized: None  POSTURE: rounded shoulders, forward head, and decreased lumbar lordosis   LUMBARAROM/PROM:  A/PROM A/PROM  Eval (% available) 07/27/24  Flexion WFL with fascial tightness in the lumbar full  Extension 75 full  Right lateral flexion 75 full  Left lateral flexion 75 full  Right rotation 100 full  Left rotation 100 full   (Blank rows = not tested)  LOWER EXTREMITY ROM: bilateral hip ROM WFL   LOWER EXTREMITY MMT: bilateral hip strength is 5/5  PALPATION: Abdominal: raises chest to contract the abdomen  Diastasis: No Distortion: No  Breathing: difficulty with expanding the lower rib cage Scar tissue: No  External Perineal Exam: redness along the labia and vulva area; decreased mobility of the perineal body                             Internal Pelvic Floor: going through the rectum the patient was not able to push the therapist finger out of the rectum instead she will contract the muscles  Patient confirms identification and approves PT to assess internal pelvic floor and treatment Yes No emotional/communication barriers or cognitive limitation. Patient is motivated to learn. Patient understands and agrees with treatment goals and plan. PT explains patient will be examined in standing, sitting, and  lying down to see how their muscles and joints work. When they are ready, they will be asked to remove their underwear so PT can examine their perineum. The patient is also given the option of providing their own chaperone as one is not provided in our facility. The patient also has the right and is explained the right to defer or refuse any part of the evaluation or treatment including the internal exam. With the patient's consent, PT will use one gloved finger to gently assess the muscles of the pelvic floor, seeing how well it contracts and relaxes and if there is muscle symmetry. After, the patient will get dressed and PT and patient will discuss exam findings and plan of care. PT and patient discuss plan of care, schedule, attendance policy and HEP activities.   PELVIC MMT:   MMT eval  Vaginal 4/5  Internal Anal Sphincter 3/5  External Anal Sphincter 1/5  Puborectalis 4/5  Diastasis Recti none  (Blank rows = not tested)          TODAY'S TREATMENT:   07/27/24 Manual: Myofascial release: Fascial release to the left lower quadrant while laying on right side to reduce fascial restrictions  Neuromuscular re-education: Down training: Sitting on physioball with diaphragmatic breathing to relax the pelvic floor for bowel movements, then lean forward like facilitating the bowel movement then doing the happy baby in sitting Breathing out with pressing ball between hands to feel pressure in the rectum to push stool out Exercises: Stretches/mobility: Cat cow 15 x Happy baby in sitting holding 1 min Quadruped with hips internally rotated rocking back and forth Thread the needle 5 times each side Therapeutic activities: Functional strengthening activities: Sitting on physioball working on generating force to push the stool out with slight lower abdominal contraction Self-care: Educated patient on a constipation paste to use for easier stool     07/13/24 Manual: Soft tissue  mobilization: Circular massage to the abdomen and the I Love you massage to promote peristalic motion of the intestines and improve movement of the stool Manual work to the diaphragm to prepare for diaphragmatic breathing.  Myofascial release: Release of the lateral abdomen to assist with the stool going from the descending colon to the colon Release of the right lower abdomen to release restrictions around the ascending colon and its attachment at the small intestines.  Neuromuscular re-education: Form correction: Laying on side with yoga block under the lower rib cage working on rib expansion bil.  Karolynn pose with breathing into the posterior lower rib cage then placed arms on yoga block to extend thoracic spine Down training: Sitting and supine working on diaphragmatic breathing to open up the pelvic floor Therapeutic activities: Functional strengthening activities: Educated patient on sitting on commode with knees above the hips, diaphragmatic breathing to relax the pelvic floor  and how to breath to generate force to push stool out. Patient was able to return demonstration                                                                                                                              DATE: 07/09/24  EVAL Examination completed, findings reviewed, pt educated on POC, HEP, and female pelvic floor anatomy, reasoning with pelvic floor assessment internally with pt consent, and abdominal massage. Pt motivated to participate in PT and agreeable to attempt recommendations.     PATIENT EDUCATION:  07/27/24 Education details: Access Code: [email or web address]; how to have a bowel movement; constipation paste Person educated: Patient Education method: Explanation, Demonstration, Tactile cues, Verbal cues, and Handouts Education comprehension: verbalized understanding, returned demonstration, verbal cues required, tactile cues required, and needs further education  HOME EXERCISE  PROGRAM: 07/27/24 Access Code: [email or web address] URL: [email or web address] Date: 07/27/2024 Prepared by: Channing Pereyra  Program Notes Smooth move tea  Exercises - Hooklying Transversus Abdominis Palpation  - 1 x daily - 7 x weekly - 1 sets - 10 reps - Seated Diaphragmatic Breathing  - 1 x daily - 7 x weekly - 1 sets - 10 reps - Seated Happy Baby With Trunk Flexion For Pelvic Relaxation  - 1 x daily - 7 x weekly - 1 sets - 2 reps - 30 sec hold - Cat Cow  - 1 x daily - 7 x weekly - 1 sets - 10 reps - Quadruped Rocking Slow  - 1 x daily - 7 x weekly - 1 sets - 10 reps - Quadruped Full Range Thoracic Rotation with Reach  - 1 x daily - 7 x weekly - 1 sets - 5 reps  Patient Education - Abdominal Massage for Constipation - Abdominal Massage for Constipation       ASSESSMENT:  CLINICAL IMPRESSION: Patient is a 43 y.o. female who was seen today for physical therapy  treatment for slow transit constipation. Left lower quadrant pain decreased from 8/10 to 6/10. Patient was able to feel pressure in the rectum while pushing stool out. She has increased lower rib cage mobility to work on diaphragmatic breathing. Patient has had the eval and 2 treatment sessions making it difficult for her to progress much. She still has difficulty to make it to the commode without leaking stool once she has the  in the left lower quadrant. She is not able to push the therapist finger out of the rectum and has a weak pelvic floor.  Patient will benefit from skilled therapy to improve coordination of pushing the stool out and reducing lower abdominal pain.   OBJECTIVE IMPAIRMENTS: decreased coordination, decreased ROM, decreased strength, increased fascial restrictions, postural dysfunction, and pain.   ACTIVITY LIMITATIONS: continence and toileting  PARTICIPATION LIMITATIONS: shopping and community activity  PERSONAL FACTORS: 1-2 comorbidities: Cholecystectomy; Hernia repair, diabetes, PTSD are also affecting  patient's functional outcome.   REHAB POTENTIAL: Excellent  CLINICAL DECISION MAKING: Evolving/moderate complexity  EVALUATION COMPLEXITY: Moderate   GOALS: Goals reviewed with patient? Yes  SHORT TERM GOALS: Target date: 06/11/24  Patient is able to perform diaphragmatic breathing to relax the pelvic floor.  Baseline: Goal status: Met 07/13/24  2.  Patient is able to expand her lower rib cage to assist with diaphragmatic breathing.  Baseline:  Goal status: Met 07/27/24  3.  Patient is independent with initial HEP for core strength.  Baseline:  Goal status: INITIAL  4.  Patient educated on abdominal massage to assist with peristalic motion of the intestines.  Baseline:  Goal status: Met 07/13/24   LONG TERM GOALS: Target date: 01/08/24  Patient independent with advanced HEP for core and pelvic floor exercises.  Baseline:  Goal status: INITIAL  2.  Patient is able to push therapist finger out of the rectum due to the ability to relax the pelvic floor and generate pressure to push the finger out.  Baseline:  Goal status: INITIAL  3.  Patient reports her lower abdominal pain decreased >/= 4/10 due to performing manual work to her abdomen and relaxing the pelvic floor.  Baseline:  Goal status: INITIAL   PLAN:  PT FREQUENCY: 1-2x/week  PT DURATION: 6 months  PLANNED INTERVENTIONS: 97110-Therapeutic exercises, 97530- Therapeutic activity, 97112- Neuromuscular re-education, 97535- Self Care, [id number]- Manual therapy, [id number]- Electrical stimulation (unattended), 20560 (1-2 muscles), 20561 (3+ muscles)- Dry Needling, Patient/Family education, Joint mobilization, Spinal mobilization, Cryotherapy, Moist heat, and Biofeedback  PLAN FOR NEXT SESSION: abdominal strength, manual work to the rectum,    Channing Pereyra, PT 07/27/24 3:01 PM

## 2024-08-05 ENCOUNTER — Encounter (INDEPENDENT_AMBULATORY_CARE_PROVIDER_SITE_OTHER): Payer: Self-pay

## 2024-08-17 ENCOUNTER — Ambulatory Visit: Attending: Obstetrics and Gynecology | Admitting: Physical Therapy

## 2024-08-17 ENCOUNTER — Telehealth: Payer: Self-pay | Admitting: Physical Therapy

## 2024-08-17 DIAGNOSIS — R109 Unspecified abdominal pain: Secondary | ICD-10-CM | POA: Insufficient documentation

## 2024-08-17 DIAGNOSIS — R293 Abnormal posture: Secondary | ICD-10-CM | POA: Insufficient documentation

## 2024-08-17 DIAGNOSIS — R278 Other lack of coordination: Secondary | ICD-10-CM | POA: Insufficient documentation

## 2024-08-17 NOTE — Telephone Encounter (Signed)
 Spoke with patient re no show for her PT appt today, she was very apologetic, she thought she cancelled it.

## 2024-08-23 ENCOUNTER — Ambulatory Visit: Attending: Obstetrics and Gynecology | Admitting: Physical Therapy

## 2024-08-23 ENCOUNTER — Encounter: Payer: Self-pay | Admitting: Physical Therapy

## 2024-08-23 DIAGNOSIS — R278 Other lack of coordination: Secondary | ICD-10-CM | POA: Diagnosis present

## 2024-08-23 DIAGNOSIS — R109 Unspecified abdominal pain: Secondary | ICD-10-CM | POA: Insufficient documentation

## 2024-08-23 DIAGNOSIS — R293 Abnormal posture: Secondary | ICD-10-CM | POA: Diagnosis present

## 2024-08-23 NOTE — Therapy (Signed)
 OUTPATIENT PHYSICAL THERAPY FEMALE PELVIC TREATMENT   Patient Name: Kellie Fisher MRN: [id number] DOB:1981/03/20, 43 y.o., female Today's Date: 08/23/2024  END OF SESSION:  PT End of Session - 08/23/24 1232     Visit Number 4    Date for Recertification  01/07/25    Authorization Type [employer]    Authorization Time Period 11/17-1/15    Authorization - Visit Number 1    Authorization - Number of Visits 5    PT Start Time 1230    PT Stop Time 1310    PT Time Calculation (min) 40 min    Activity Tolerance Patient tolerated treatment well    Behavior During Therapy WFL for tasks assessed/performed          Past Medical History:  Diagnosis Date   Ankle fracture    Anxiety    Daytime somnolence    Depression    Diabetes mellitus without complication (HCC)    Eczema    Migraine    PTSD (post-traumatic stress disorder)    Tumor containing calcium    Urticaria    Past Surgical History:  Procedure Laterality Date   CHOLECYSTECTOMY     GANGLION CYST EXCISION Left    HERNIA REPAIR     Patient Active Problem List   Diagnosis Date Noted   Cellulitis of oral soft tissues 04/13/2017   Depression with anxiety 04/13/2017    PCP: Burney Darice CROME, MD  REFERRING PROVIDER: Katharina Pellet, MD  REFERRING DIAG: K59.01 (ICD-10-CM) - Slow transit constipation   THERAPY DIAG:  Other lack of coordination  Abdominal pain, unspecified abdominal location  Abnormal posture  Rationale for Evaluation and Treatment: Rehabilitation  ONSET DATE: 2020  SUBJECTIVE:                                                                                                                                                                                           SUBJECTIVE STATEMENT: I feel down the stairs and hurt my knee and popped a rib. I have not had the build up of extreme abdominal pain from constipation. Sitting for 10 minutes to have a bowel movement.  Fluid intake: water,  ginger ale, tea, red bull  FUNCTIONAL LIMITATIONS: none  PERTINENT HISTORY:  Medications for current condition: none Surgeries: Cholecystectomy; Hernia repair Other: Diabetes, PTSD Sexual abuse: No  PAIN:  Are you having pain? Yes NPRS scale: 8/10 07/27/24; 6/10 pain level 08/23/24: 0/10 Pain location: starts at the left lower quadrant then goes to the left  Pain type: sharp and stabbing Pain description: intermittent   Aggravating factors: happens when she is constipated; sharp pain  happens when she has to go to the bathroom Relieving factors: having a bowel movement and happens 1-2 hours after bowel movement  PRECAUTIONS: None  RED FLAGS: None   WEIGHT BEARING RESTRICTIONS: No  FALLS:  Has patient fallen in last 6 months? No  OCCUPATION: not working  ACTIVITY LEVEL : 2-3 times per week with walk or elliptical  PLOF: Independent  PATIENT GOALS: need therapy prior to surgery; learn how to have a bowel movement   BOWEL MOVEMENT: Pain with bowel movement: Yes Once she gets the urge to have a bowel movement she has to hurry up to the commode; patient has to lean back to have a bowel movement Type of bowel movement:Type (Bristol Stool Scale) Type 1,2, 6, 7, Frequency tries to have a bowel movement daily, Strain yes, and Splinting hold her abdomen and 2 times had to press on the vaginal opening Fully empty rectum: Yes: but has to sit for 30 minutes or more and after she eats Leakage: Yes: 3 accidents in last 5 years                                                     Caused by: when has diarrhea Pads: No Fiber supplement/laxative exlax every other day  URINATION: Pain with urination: No Fully empty bladder: Yes: wait then try again to fully empty                                Post-void dribble: No Stream: Strong Urgency: No Frequency:during the day 3-4 times                                                         Nocturia: Yes:  4 times or more depending on her sugar  levels   Leakage: Coughing, Sneezing, and Laughing Pads/briefs: Yes: 1 pad  INTERCOURSE:not sexually active  Ability to have vaginal penetration Yes  not painful in the past  PREGNANCY: Vaginal deliveries 2 Tearing Yes: tore with both; took 30 minutes to sew her up   PROLAPSE: Bulge   OBJECTIVE:  Note: Objective measures were completed at Evaluation unless otherwise noted.  DIAGNOSTIC FINDINGS:  none  PATIENT SURVEYS:  PFIQ-7: 44 UIQ-7: 0 CRAIQ-7: 52 07/27/24: PFIQ-7: 57 UIQ-7: 24 CRAIQ-7: 43  COGNITION: Overall cognitive status: Within functional limits for tasks assessed     SENSATION: Light touch: Appears intact    FUNCTIONAL TESTS:  Single leg stance:  Rt: drop of left hip  Ou:imne of right hip   GAIT: Assistive device utilized: None  POSTURE: rounded shoulders, forward head, and decreased lumbar lordosis   LUMBARAROM/PROM:  A/PROM A/PROM  Eval (% available) 07/27/24  Flexion WFL with fascial tightness in the lumbar full  Extension 75 full  Right lateral flexion 75 full  Left lateral flexion 75 full  Right rotation 100 full  Left rotation 100 full   (Blank rows = not tested)  LOWER EXTREMITY ROM: bilateral hip ROM WFL   LOWER EXTREMITY MMT: bilateral hip strength is 5/5  PALPATION: Abdominal: raises chest to contract the abdomen  Diastasis: No Distortion: No  Breathing: difficulty with expanding the lower rib cage Scar tissue: No                External Perineal Exam: redness along the labia and vulva area; decreased mobility of the perineal body                             Internal Pelvic Floor: going through the rectum the patient was not able to push the therapist finger out of the rectum instead she will contract the muscles  Patient confirms identification and approves PT to assess internal pelvic floor and treatment Yes No emotional/communication barriers or cognitive limitation. Patient is motivated to learn. Patient understands  and agrees with treatment goals and plan. PT explains patient will be examined in standing, sitting, and lying down to see how their muscles and joints work. When they are ready, they will be asked to remove their underwear so PT can examine their perineum. The patient is also given the option of providing their own chaperone as one is not provided in our facility. The patient also has the right and is explained the right to defer or refuse any part of the evaluation or treatment including the internal exam. With the patient's consent, PT will use one gloved finger to gently assess the muscles of the pelvic floor, seeing how well it contracts and relaxes and if there is muscle symmetry. After, the patient will get dressed and PT and patient will discuss exam findings and plan of care. PT and patient discuss plan of care, schedule, attendance policy and HEP activities.   PELVIC MMT:   MMT eval  Vaginal 4/5  Internal Anal Sphincter 3/5  External Anal Sphincter 1/5  Puborectalis 4/5  Diastasis Recti none  (Blank rows = not tested)          TODAY'S TREATMENT:   08/23/24 Manual: Soft tissue mobilization: Scar tissue mobilization: Myofascial release: Spinal mobilization: Internal pelvic floor techniques: Dry needling: Neuromuscular re-education: Core retraining: Supine transverse abdominus contraction with ball squeeze Supine alternate shoulder flexion holding 1 # Supine bilateral shoulder extension with green band Alternate hip flexion isometric to engage the lower abdomen Supine with knees and hips at 90/90 moving right leg into extension and back Supine anti rotation with green band to work the obliques    07/27/24 Manual: Myofascial release: Fascial release to the left lower quadrant while laying on right side to reduce fascial restrictions  Neuromuscular re-education: Down training: Sitting on physioball with diaphragmatic breathing to relax the pelvic floor for bowel movements,  then lean forward like facilitating the bowel movement then doing the happy baby in sitting Breathing out with pressing ball between hands to feel pressure in the rectum to push stool out Exercises: Stretches/mobility: Cat cow 15 x Happy baby in sitting holding 1 min Quadruped with hips internally rotated rocking back and forth Thread the needle 5 times each side Therapeutic activities: Functional strengthening activities: Sitting on physioball working on generating force to push the stool out with slight lower abdominal contraction Self-care: Educated patient on a constipation paste to use for easier stool     07/13/24 Manual: Soft tissue mobilization: Circular massage to the abdomen and the I Love you massage to promote peristalic motion of the intestines and improve movement of the stool Manual work to the diaphragm to prepare for diaphragmatic breathing.  Myofascial release: Release of the lateral abdomen to assist with the stool going from the  descending colon to the colon Release of the right lower abdomen to release restrictions around the ascending colon and its attachment at the small intestines.  Neuromuscular re-education: Form correction: Laying on side with yoga block under the lower rib cage working on rib expansion bil.  Karolynn pose with breathing into the posterior lower rib cage then placed arms on yoga block to extend thoracic spine Down training: Sitting and supine working on diaphragmatic breathing to open up the pelvic floor Therapeutic activities: Functional strengthening activities: Educated patient on sitting on commode with knees above the hips, diaphragmatic breathing to relax the pelvic floor and how to breath to generate force to push stool out. Patient was able to return demonstration       PATIENT EDUCATION:  07/27/24 Education details: Access Code: [email or web address]; how to have a bowel movement; constipation paste Person educated: Patient Education method:  Explanation, Demonstration, Tactile cues, Verbal cues, and Handouts Education comprehension: verbalized understanding, returned demonstration, verbal cues required, tactile cues required, and needs further education  HOME EXERCISE PROGRAM: 07/27/24 Access Code: [email or web address] URL: [email or web address] Date: 07/27/2024 Prepared by: Channing Pereyra  Program Notes Smooth move tea  Exercises - Hooklying Transversus Abdominis Palpation  - 1 x daily - 7 x weekly - 1 sets - 10 reps - Seated Diaphragmatic Breathing  - 1 x daily - 7 x weekly - 1 sets - 10 reps - Seated Happy Baby With Trunk Flexion For Pelvic Relaxation  - 1 x daily - 7 x weekly - 1 sets - 2 reps - 30 sec hold - Cat Cow  - 1 x daily - 7 x weekly - 1 sets - 10 reps - Quadruped Rocking Slow  - 1 x daily - 7 x weekly - 1 sets - 10 reps - Quadruped Full Range Thoracic Rotation with Reach  - 1 x daily - 7 x weekly - 1 sets - 5 reps  Patient Education - Abdominal Massage for Constipation - Abdominal Massage for Constipation       ASSESSMENT:  CLINICAL IMPRESSION: Patient is a 43 y.o. female who was seen today for physical therapy  treatment for slow transit constipation. Patient is sitting for 10 minutes to have a bowel movement. She is having more frequent bowel movements.  Patient had to spend time in the bathroom and had increased abdominal discomfort after the bowel movement. Patient will benefit from skilled therapy to improve coordination of pushing the stool out and reducing lower abdominal pain.   OBJECTIVE IMPAIRMENTS: decreased coordination, decreased ROM, decreased strength, increased fascial restrictions, postural dysfunction, and pain.   ACTIVITY LIMITATIONS: continence and toileting  PARTICIPATION LIMITATIONS: shopping and community activity  PERSONAL FACTORS: 1-2 comorbidities: Cholecystectomy; Hernia repair, diabetes, PTSD are also affecting patient's functional outcome.   REHAB POTENTIAL:  Excellent  CLINICAL DECISION MAKING: Evolving/moderate complexity  EVALUATION COMPLEXITY: Moderate   GOALS: Goals reviewed with patient? Yes  SHORT TERM GOALS: Target date: 06/11/24  Patient is able to perform diaphragmatic breathing to relax the pelvic floor.  Baseline: Goal status: Met 07/13/24  2.  Patient is able to expand her lower rib cage to assist with diaphragmatic breathing.  Baseline:  Goal status: Met 07/27/24  3.  Patient is independent with initial HEP for core strength.  Baseline:  Goal status: Met 08/23/24  4.  Patient educated on abdominal massage to assist with peristalic motion of the intestines.  Baseline:  Goal status: Met 07/13/24   LONG TERM GOALS: Target date: 01/08/24  Patient independent with advanced  HEP for core and pelvic floor exercises.  Baseline:  Goal status: INITIAL  2.  Patient is able to push therapist finger out of the rectum due to the ability to relax the pelvic floor and generate pressure to push the finger out.  Baseline:  Goal status: INITIAL  3.  Patient reports her lower abdominal pain decreased >/= 4/10 due to performing manual work to her abdomen and relaxing the pelvic floor.  Baseline:  Goal status: INITIAL   PLAN:  PT FREQUENCY: 1-2x/week  PT DURATION: 6 months  PLANNED INTERVENTIONS: 97110-Therapeutic exercises, 97530- Therapeutic activity, 97112- Neuromuscular re-education, 97535- Self Care, [id number]- Manual therapy, [id number]- Electrical stimulation (unattended), 20560 (1-2 muscles), 20561 (3+ muscles)- Dry Needling, Patient/Family education, Joint mobilization, Spinal mobilization, Cryotherapy, Moist heat, and Biofeedback  PLAN FOR NEXT SESSION: abdominal strength, manual work to the rectum,    Channing Pereyra, PT 08/23/24 1:17 PM

## 2024-08-30 ENCOUNTER — Telehealth: Payer: Self-pay | Admitting: Physical Therapy

## 2024-08-30 ENCOUNTER — Ambulatory Visit: Admitting: Physical Therapy

## 2024-08-30 NOTE — Telephone Encounter (Signed)
 Called patient about her missed appointment today at 11:45. She does not drive in bad weather and it is snowing out. She ahs another appt. On 1/15.  Channing Pereyra, PT @12 /8/25@ 12:11 PM

## 2024-10-07 ENCOUNTER — Encounter: Payer: Self-pay | Attending: Obstetrics and Gynecology | Admitting: Physical Therapy

## 2024-10-07 ENCOUNTER — Encounter: Payer: Self-pay | Admitting: Physical Therapy

## 2024-10-07 ENCOUNTER — Encounter (INDEPENDENT_AMBULATORY_CARE_PROVIDER_SITE_OTHER): Payer: Self-pay

## 2024-10-07 DIAGNOSIS — R293 Abnormal posture: Secondary | ICD-10-CM | POA: Diagnosis present

## 2024-10-07 DIAGNOSIS — R109 Unspecified abdominal pain: Secondary | ICD-10-CM | POA: Diagnosis present

## 2024-10-07 DIAGNOSIS — R278 Other lack of coordination: Secondary | ICD-10-CM | POA: Diagnosis present

## 2024-10-07 NOTE — Therapy (Signed)
 " OUTPATIENT PHYSICAL THERAPY FEMALE PELVIC TREATMENT   Patient Name: Kellie Fisher MRN: [id number] DOB:09-25-1980, 44 y.o., female Today's Date: 10/07/2024  END OF SESSION:  PT End of Session - 10/07/24 1306     Visit Number 5    Date for Recertification  01/07/25    Authorization Type [employer]    Authorization Time Period 11/17-1/15    Authorization - Visit Number 2    Authorization - Number of Visits 5    PT Start Time 1300    PT Stop Time 1345    PT Time Calculation (min) 45 min    Activity Tolerance Patient tolerated treatment well    Behavior During Therapy WFL for tasks assessed/performed          Past Medical History:  Diagnosis Date   Ankle fracture    Anxiety    Daytime somnolence    Depression    Diabetes mellitus without complication (HCC)    Eczema    Migraine    PTSD (post-traumatic stress disorder)    Tumor containing calcium    Urticaria    Past Surgical History:  Procedure Laterality Date   CHOLECYSTECTOMY     GANGLION CYST EXCISION Left    HERNIA REPAIR     Patient Active Problem List   Diagnosis Date Noted   Cellulitis of oral soft tissues 04/13/2017   Depression with anxiety 04/13/2017    PCP: Burney Darice CROME, MD  REFERRING PROVIDER: Katharina Pellet, MD  REFERRING DIAG: K59.01 (ICD-10-CM) - Slow transit constipation   THERAPY DIAG:  Other lack of coordination  Abdominal pain, unspecified abdominal location  Abnormal posture  Rationale for Evaluation and Treatment: Rehabilitation  ONSET DATE: 2020  SUBJECTIVE:                                                                                                                                                                                           SUBJECTIVE STATEMENT: Vaginal hysterectomy, Bilateral removal of fallopian tubes, possible with VNOTES assistance, Apical suspension to bilateral uterosacral ligaments, Midvaginal repairs to rectum, Anal sphincteroplasty with  perineorrhaphy, Midurethral sling, cystoscopy  Fluid intake: water, ginger ale, tea, red bull  FUNCTIONAL LIMITATIONS: none  PERTINENT HISTORY:  Medications for current condition: none Surgeries: Cholecystectomy; Hernia repair Other: Diabetes, PTSD Sexual abuse: No  PAIN:  Are you having pain? Yes NPRS scale: 8/10 07/27/24; 6/10 pain level 08/23/24: 0/10 Pain location: starts at the left lower quadrant then goes to the left  Pain type: sharp and stabbing Pain description: intermittent   Aggravating factors: happens when she is constipated; sharp pain happens when she has to go  to the bathroom Relieving factors: having a bowel movement and happens 1-2 hours after bowel movement  PRECAUTIONS: None  RED FLAGS: None   WEIGHT BEARING RESTRICTIONS: No  FALLS:  Has patient fallen in last 6 months? No  OCCUPATION: not working  ACTIVITY LEVEL : 2-3 times per week with walk or elliptical  PLOF: Independent  PATIENT GOALS: need therapy prior to surgery; learn how to have a bowel movement   BOWEL MOVEMENT: Pain with bowel movement: Yes Once she gets the urge to have a bowel movement she has to hurry up to the commode; patient has to lean back to have a bowel movement Type of bowel movement:Type (Bristol Stool Scale) Type 1,2, 6, 7, Frequency tries to have a bowel movement daily, Strain yes, and Splinting hold her abdomen and 2 times had to press on the vaginal opening Fully empty rectum: Yes: but has to sit for 30 minutes or more and after she eats Leakage: Yes: 3 accidents in last 5 years                                                     Caused by: when has diarrhea Pads: No Fiber supplement/laxative exlax every other day  URINATION: Pain with urination: No Fully empty bladder: Yes: wait then try again to fully empty                                Post-void dribble: No Stream: Strong Urgency: No Frequency:during the day 3-4 times                                                          Nocturia: Yes:  4 times or more depending on her sugar levels   Leakage: Coughing, Sneezing, and Laughing Pads/briefs: Yes: 1 pad  INTERCOURSE:not sexually active  Ability to have vaginal penetration Yes  not painful in the past  PREGNANCY: Vaginal deliveries 2 Tearing Yes: tore with both; took 30 minutes to sew her up   PROLAPSE: Bulge   OBJECTIVE:  Note: Objective measures were completed at Evaluation unless otherwise noted.  DIAGNOSTIC FINDINGS:  none  PATIENT SURVEYS:  PFIQ-7: 44 UIQ-7: 0 CRAIQ-7: 52 07/27/24: PFIQ-7: 57 UIQ-7: 24 CRAIQ-7: 43  COGNITION: Overall cognitive status: Within functional limits for tasks assessed     SENSATION: Light touch: Appears intact    FUNCTIONAL TESTS:  Single leg stance:  Rt: drop of left hip  Ou:imne of right hip   GAIT: Assistive device utilized: None  POSTURE: rounded shoulders, forward head, and decreased lumbar lordosis   LUMBARAROM/PROM:  A/PROM A/PROM  Eval (% available) 07/27/24  Flexion WFL with fascial tightness in the lumbar full  Extension 75 full  Right lateral flexion 75 full  Left lateral flexion 75 full  Right rotation 100 full  Left rotation 100 full   (Blank rows = not tested)  LOWER EXTREMITY ROM: bilateral hip ROM WFL   LOWER EXTREMITY MMT: bilateral hip strength is 5/5  PALPATION: Abdominal: raises chest to contract the abdomen  Diastasis: No Distortion: No  Breathing: difficulty with expanding the  lower rib cage Scar tissue: No                External Perineal Exam: redness along the labia and vulva area; decreased mobility of the perineal body                             Internal Pelvic Floor: going through the rectum the patient was not able to push the therapist finger out of the rectum instead she will contract the muscles  Patient confirms identification and approves PT to assess internal pelvic floor and treatment Yes No emotional/communication barriers or  cognitive limitation. Patient is motivated to learn. Patient understands and agrees with treatment goals and plan. PT explains patient will be examined in standing, sitting, and lying down to see how their muscles and joints work. When they are ready, they will be asked to remove their underwear so PT can examine their perineum. The patient is also given the option of providing their own chaperone as one is not provided in our facility. The patient also has the right and is explained the right to defer or refuse any part of the evaluation or treatment including the internal exam. With the patient's consent, PT will use one gloved finger to gently assess the muscles of the pelvic floor, seeing how well it contracts and relaxes and if there is muscle symmetry. After, the patient will get dressed and PT and patient will discuss exam findings and plan of care. PT and patient discuss plan of care, schedule, attendance policy and HEP activities.   PELVIC MMT:   MMT eval  Vaginal 4/5  Internal Anal Sphincter 3/5  External Anal Sphincter 1/5  Puborectalis 4/5  Diastasis Recti none  (Blank rows = not tested)          TODAY'S TREATMENT:   10/07/24 Neuromuscular re-education: Pelvic floor contraction training: Supine transverse abdominus contraction Supine abdominal contraction with alternate shoulder flexion Down training: Diaphragmatic breathing in supine, laying on side and quadruped, sitting Exercises: Stretches/mobility: Cat cow Quadruped rock Therapeutic activities: Functional strengthening activities: Educated patient on how to go from sit to stand and back with hip hinging and breath Educated patient on rolling side to side like a log,  Self-care: Educated patient on what the doctor will repair during surgery and showed her on the model Educated patient to ice the perineal area, lay down with hips on pillow and legs elevated for 10 minutes 2-3 x per day for 2 weeks Educated patient to  get a squirt bottle to clean the perineal area, not to strain to have a bowel movement Educated patient on abdominal massage for after surgery     08/23/24 Neuromuscular re-education: Core retraining: Supine transverse abdominus contraction with ball squeeze Supine alternate shoulder flexion holding 1 # Supine bilateral shoulder extension with green band Alternate hip flexion isometric to engage the lower abdomen Supine with knees and hips at 90/90 moving right leg into extension and back Supine anti rotation with green band to work the obliques    07/27/24 Manual: Myofascial release: Fascial release to the left lower quadrant while laying on right side to reduce fascial restrictions  Neuromuscular re-education: Down training: Sitting on physioball with diaphragmatic breathing to relax the pelvic floor for bowel movements, then lean forward like facilitating the bowel movement then doing the happy baby in sitting Breathing out with pressing ball between hands to feel pressure in the rectum to push stool out  Exercises: Stretches/mobility: Cat cow 15 x Happy baby in sitting holding 1 min Quadruped with hips internally rotated rocking back and forth Thread the needle 5 times each side Therapeutic activities: Functional strengthening activities: Sitting on physioball working on generating force to push the stool out with slight lower abdominal contraction Self-care: Educated patient on a constipation paste to use for easier stool     07/13/24 Manual: Soft tissue mobilization: Circular massage to the abdomen and the I Love you massage to promote peristalic motion of the intestines and improve movement of the stool Manual work to the diaphragm to prepare for diaphragmatic breathing.  Myofascial release: Release of the lateral abdomen to assist with the stool going from the descending colon to the colon Release of the right lower abdomen to release restrictions around the ascending  colon and its attachment at the small intestines.  Neuromuscular re-education: Form correction: Laying on side with yoga block under the lower rib cage working on rib expansion bil.  Karolynn pose with breathing into the posterior lower rib cage then placed arms on yoga block to extend thoracic spine Down training: Sitting and supine working on diaphragmatic breathing to open up the pelvic floor Therapeutic activities: Functional strengthening activities: Educated patient on sitting on commode with knees above the hips, diaphragmatic breathing to relax the pelvic floor and how to breath to generate force to push stool out. Patient was able to return demonstration       PATIENT EDUCATION:  10/07/24 Education details: Access Code: [email or web address]; how to have a bowel movement; constipation paste Person educated: Patient Education method: Explanation, Demonstration, Tactile cues, Verbal cues, and Handouts Education comprehension: verbalized understanding, returned demonstration, verbal cues required, tactile cues required, and needs further education  HOME EXERCISE PROGRAM: 10/07/24 Access Code: [email or web address] URL: [email or web address] Date: 10/07/2024 Prepared by: Channing Pereyra  Program Notes Smooth move tea  Exercises - Hooklying Transversus Abdominis Palpation  - 1 x daily - 7 x weekly - 1 sets - 10 reps - Seated Diaphragmatic Breathing  - 1 x daily - 7 x weekly - 1 sets - 10 reps - Cat Cow  - 1 x daily - 7 x weekly - 1 sets - 10 reps - Quadruped Rocking Slow  - 1 x daily - 7 x weekly - 1 sets - 10 reps - Supine Alternating Shoulder Flexion  - 1 x daily - 7 x weekly - 1 sets - 10 reps  Patient Education - Abdominal Massage for Constipation - Abdominal Massage for Constipation       ASSESSMENT:  CLINICAL IMPRESSION: Patient is a 44 y.o. female who was seen today for physical therapy  treatment for slow transit constipation. Patient is having Vaginal hysterectomy, Bilateral  removal of fallopian tubes, possible with VNOTES assistance, Apical suspension to bilateral uterosacral ligaments, Midvaginal repairs to rectum, Anal sphincteroplasty with perineorrhaphy, Midurethral sling, cystoscopy in the future. Today we went over what the surgery is, what to do after the surgery, how to take care of the perineal body, how to toilet, what exercises she can do when the doctor gives her the okay, and perineal massage for after surgery when the doctor gives her clearance. She understands how to relax the pelvic floor with breathing. Patient is being discharge due to change in medical status.   OBJECTIVE IMPAIRMENTS: decreased coordination, decreased ROM, decreased strength, increased fascial restrictions, postural dysfunction, and pain.   ACTIVITY LIMITATIONS: continence and toileting  PARTICIPATION LIMITATIONS: shopping and community activity  PERSONAL FACTORS: 1-2 comorbidities: Cholecystectomy;  Hernia repair, diabetes, PTSD are also affecting patient's functional outcome.   REHAB POTENTIAL: Excellent  CLINICAL DECISION MAKING: Evolving/moderate complexity  EVALUATION COMPLEXITY: Moderate   GOALS: Goals reviewed with patient? Yes  SHORT TERM GOALS: Target date: 06/11/24  Patient is able to perform diaphragmatic breathing to relax the pelvic floor.  Baseline: Goal status: Met 07/13/24  2.  Patient is able to expand her lower rib cage to assist with diaphragmatic breathing.  Baseline:  Goal status: Met 07/27/24  3.  Patient is independent with initial HEP for core strength.  Baseline:  Goal status: Met 08/23/24  4.  Patient educated on abdominal massage to assist with peristalic motion of the intestines.  Baseline:  Goal status: Met 07/13/24   LONG TERM GOALS: Target date: 01/08/24  Patient independent with advanced HEP for core and pelvic floor exercises.  Baseline:  Goal status: Met 10/07/24  2.  Patient is able to push therapist finger out of the rectum  due to the ability to relax the pelvic floor and generate pressure to push the finger out.  Baseline:  Goal status: Not met 10/07/24  3.  Patient reports her lower abdominal pain decreased >/= 4/10 due to performing manual work to her abdomen and relaxing the pelvic floor.  Baseline:  Goal status: not met 10/07/24   PLAN:Discharge to HEP this visit  Channing Pereyra, PT 10/07/24 1:59 PM  PHYSICAL THERAPY DISCHARGE SUMMARY  Visits from Start of Care: 5  Current functional level related to goals / functional outcomes: See above.    Remaining deficits: See above.   Education / Equipment: HEP   Patient agrees to discharge. Patient goals were not met. Patient is being discharged due to a change in medical status. Thank you for the referral.  Channing Pereyra, PT 10/07/24 1:59 PM   "

## 2024-10-07 NOTE — Patient Instructions (Signed)
 How To Poop Better:  What are Good Poops? There is no one exact normal, but they should be REGULAR.  This varies from person to person and ranges from up to 3x/day or as little as 3-4/week.  This should stay consistent for you.  They should be formed and ideally one solid mass that doesn't fall apart or dissolve in the water and is brown in color.  Lifestyle Tips:  Fiber: Eat 25-31 grams per day Do not hold it.  If you need to go, GO! Try to go every day around the same time Walk and move more Probiotics for more healthy gut bacteria Water and fluids: half of your healthy body weight in ounces  Toileting Tips:  Posture: knees above hips, back flat, look straight ahead, RELAX Relax all the muscles from your face down to your toes Breathe: slow deep breaths into your belly and pelvic floor is RELAXED Blow: Tighten belly and blow like blowing up a balloon, make SH sound, make a vowel sound with a deep voice Do NOT sit more than 10 minutes After you are finished, tighten the muscles to reset pelvic floor back to normal    About Abdominal Massage  Abdominal massage, also called external colon massage, is a self-treatment circular massage technique that can reduce and eliminate gas and ease constipation. The colon naturally contracts in waves in a clockwise direction starting from inside the right hip, moving up toward the ribs, across the belly, and down inside the left hip.  When you perform circular abdominal massage, you help stimulate your colons normal wave pattern of movement called peristalsis.  It is most beneficial when done after eating.  Positioning You can practice abdominal massage with oil while lying down, or in the shower with soap.  Some people find that it is just as effective to do the massage through clothing while sitting or standing.  How to Massage Start by placing your finger tips or knuckles on your right side, just inside your hip bone.  Make small circular  movements while you move upward toward your rib cage.   Once you reach the bottom right side of your rib cage, take your circular movements across to the left side of the bottom of your rib cage.  Next, move downward until you reach the inside of your left hip bone.  This is the path your feces travel in your colon. Continue to perform your abdominal massage in this pattern for 10 minutes each day.       You can apply as much pressure as is comfortable in your massage.  Start gently and build pressure as you continue to practice.  Notice any areas of pain as you massage; areas of slight pain may be relieved as you massage, but if you have areas of significant or intense pain, consult with your healthcare provider.  Other Considerations General physical activity including bending and stretching can have a beneficial massage-like effect on the colon.  Deep breathing can also stimulate the colon because breathing deeply activates the same nervous system that supplies the colon.   Abdominal massage should always be used in combination with a bowel-conscious diet that is high in the proper type of fiber for you, fluids (primarily water), and a regular exercise program.   ice the perineal area, lay down with hips on pillow and legs elevated for 10 minutes 2-3 x per day for 2 weeks get a squirt bottle to clean the perineal area, not to strain to  have a bowel movement Follow MD orders for lifting Shapewear that goes to your rib cage for after surgery Do the diaphragmatic breathing in sitting, laying on back,, laying side Ask doctor when you can do kegels  Kellie Fisher, Kellie Fisher [facility] [address] [place], [place] [address] W: [phone] [email or web address]

## 2024-10-21 ENCOUNTER — Encounter (HOSPITAL_BASED_OUTPATIENT_CLINIC_OR_DEPARTMENT_OTHER): Admitting: Obstetrics and Gynecology

## 2024-10-27 ENCOUNTER — Encounter: Admitting: Obstetrics & Gynecology

## 2024-10-28 ENCOUNTER — Ambulatory Visit
Admission: EM | Admit: 2024-10-28 | Discharge: 2024-10-28 | Disposition: A | Discharge status: Home / Self Care | Source: Home / Self Care

## 2024-10-28 ENCOUNTER — Ambulatory Visit (INDEPENDENT_AMBULATORY_CARE_PROVIDER_SITE_OTHER)

## 2024-10-28 DIAGNOSIS — S63634A Sprain of interphalangeal joint of right ring finger, initial encounter: Secondary | ICD-10-CM

## 2024-10-28 DIAGNOSIS — S61215A Laceration without foreign body of left ring finger without damage to nail, initial encounter: Secondary | ICD-10-CM

## 2024-10-28 MED ORDER — MUPIROCIN 2 % EX OINT: 1.0000 | TOPICAL_OINTMENT | Freq: Two times a day (BID) | CUTANEOUS | 0 refills | Status: AC

## 2024-10-28 NOTE — ED Triage Notes (Signed)
 Pt reports fall that resulting in an injury to the right ring finger small laceration is present. Pt states the pain is all the way into her arm.

## 2024-10-28 NOTE — ED Provider Notes (Signed)
 " [facility]    CSN: [id number] Arrival date & time: 10/28/24  1201      History   Chief Complaint No chief complaint on file.   HPI Kellie Fisher is a 44 y.o. female.   Patient presents with injury to her right ring finger after fall that occurred 30 minutes prior to arrival.  Patient states that she tripped and fell over her dog and her hand slammed into a cabinet and her ring finger got caught in the cabinet and cut her finger.  Patient now endorses pain to her ring finger that seems to radiate down to her wrist.  Patient reports that she is having difficulty bending at the distal joint of her ring finger and there is some swelling here.  Patient denies any other injuries from this fall.  Of note patient reports previous history of anaphylaxis with ibuprofen  and therefore has been told to avoid NSAIDs altogether because of this.  Also of note patient reports that she has received a Tdap in the last 5 years.  The history is provided by the patient and medical records.    Past Medical History:  Diagnosis Date   Ankle fracture    Anxiety    Daytime somnolence    Depression    Diabetes mellitus without complication (HCC)    Eczema    Migraine    PTSD (post-traumatic stress disorder)    Tumor containing calcium    Urticaria     Patient Active Problem List   Diagnosis Date Noted   Cellulitis of oral soft tissues 04/13/2017   Depression with anxiety 04/13/2017    Past Surgical History:  Procedure Laterality Date   CHOLECYSTECTOMY     GANGLION CYST EXCISION Left    HERNIA REPAIR      OB History   No obstetric history on file.      Home Medications    Prior to Admission medications  Medication Sig Start Date End Date Taking? Authorizing Provider  AUVELITY 45-105 MG TBCR Take 1 tablet by mouth 2 (two) times daily. 10/04/24  Yes [provider]  mupirocin  ointment (BACTROBAN ) 2 % Apply 1 Application topically 2 (two) times daily. 10/28/24  Yes  Johnie, Vasiliki Smaldone A, NP  acetaminophen  (TYLENOL ) 500 MG tablet Take 1,000 mg by mouth every 6 (six) hours as needed. For pain.    [provider]  BYDUREON 2 MG PEN Inject 2 mg into the skin once a week. 02/28/17   [provider]  escitalopram  (LEXAPRO ) 20 MG tablet Take 20 mg by mouth daily. 03/30/17   [provider]  etonogestrel-ethinyl estradiol (NUVARING) 0.12-0.015 MG/24HR vaginal ring Place 1 each vaginally every 28 (twenty-eight) days. Insert vaginally and leave in place for 3 consecutive weeks, then remove for 1 week.    [provider]  famotidine  (PEPCID ) 40 MG tablet TAKE 1 TABLET BY MOUTH TWICE A DAY 12/06/19   Kozlow, Camellia PARAS, MD  fluticasone (FLONASE) 50 MCG/ACT nasal spray SHAKE LQ AND U 2 SPRAYS IEN D 11/24/18   [provider]  lamoTRIgine (LAMICTAL) 150 MG tablet Take 150 mg by mouth at bedtime. 04/16/18   [provider]  NURTEC 75 MG TBDP Take 1 tablet by mouth every other day.    [provider]  rizatriptan (MAXALT-MLT) 10 MG disintegrating tablet TAKE 1 TABLET BY MOUTH AT ONSET OF MIGRAINE, MAY REPEAT IN 2 HOURS 03/12/19   [provider]  albuterol  (PROVENTIL  HFA;VENTOLIN  HFA) 108 (90 Base)  MCG/ACT inhaler Inhale 1-2 puffs into the lungs every 6 (six) hours as needed for wheezing or shortness of breath. Patient not taking: Reported on 10/27/2019 10/17/17 04/14/20  Burky, Natalie B, NP  diphenhydrAMINE  (BENADRYL ) 25 MG tablet Take 1 tablet (25 mg total) by mouth every 6 (six) hours. Patient not taking: Reported on 10/27/2019 04/17/19 04/14/20  Randol Simmonds, MD    Family History Family History  Problem Relation Age of Onset   Mental illness Mother    Other Mother        reflex sympathetic dystrophy   Alcohol abuse Father    Autism Son        x2    Social History Social History[1]   Allergies   Azithromycin, Nsaids, Vitamin a, Benzoyl peroxide, and Metformin   Review of Systems Review of Systems  Per  HPI  Physical Exam Triage Vital Signs ED Triage Vitals  Encounter Vitals Group     BP 10/28/24 1233 120/80     Girls Systolic BP Percentile --      Girls Diastolic BP Percentile --      Boys Systolic BP Percentile --      Boys Diastolic BP Percentile --      Pulse Rate 10/28/24 1233 87     Resp 10/28/24 1233 20     Temp 10/28/24 1233 98.2 F (36.8 C)     Temp Source 10/28/24 1233 Oral     SpO2 10/28/24 1233 94 %     Weight --      Height --      Head Circumference --      Peak Flow --      Pain Score 10/28/24 1236 4     Pain Loc --      Pain Education --      Exclude from Growth Chart --    No data found.  Updated Vital Signs BP 120/80 (BP Location: Right Arm)   Pulse 87   Temp 98.2 F (36.8 C) (Oral)   Resp 20   SpO2 94%   Visual Acuity Right Eye Distance:   Left Eye Distance:   Bilateral Distance:    Right Eye Near:   Left Eye Near:    Bilateral Near:     Physical Exam Vitals and nursing note reviewed.  Constitutional:      General: She is awake. She is not in acute distress.    Appearance: Normal appearance. She is well-developed and well-groomed. She is not ill-appearing.  Musculoskeletal:     Right hand: Swelling, laceration and tenderness present. Decreased range of motion. Normal strength. Normal sensation. There is no disruption of two-point discrimination. Normal capillary refill. Normal pulse.       Hands:     Comments: Mild swelling tenderness, and decreased range of motion noted to DIP joint of right ring finger.  2 approximately 1 cm superficial lacerations noted to distal ring finger just below DIP joint  Skin:    General: Skin is warm and dry.  Neurological:     General: No focal deficit present.     Mental Status: She is alert and oriented to person, place, and time. Mental status is at baseline.  Psychiatric:        Behavior: Behavior is cooperative.      UC Treatments / Results  Labs (all labs ordered are listed, but only abnormal  results are displayed) Labs Reviewed - No data to display  EKG   Radiology No results found.  Procedures Procedures (including critical care time)  Medications Ordered in UC Medications - No data to display  Initial Impression / Assessment and Plan / UC Course  I have reviewed the triage vital signs and the nursing notes.  Pertinent labs & imaging results that were available during my care of the patient were reviewed by me and considered in my medical decision making (see chart for details).     Patient is overall well-appearing.  Vitals are stable.  X-ray ordered.  I independently interpreted these images and there appears to be no acute osseous abnormality at this time.  Radiology report confirms this. Suspect injury likely sprain in nature.  Provided patient with finger splint.  Recommended Tylenol  and RICE therapy for pain and inflammation.  Provided basic wound care in clinic and antibiotic ointment and dressing applied.  Deferred Tdap due to being up-to-date within the last 5 years per patient.  Discussed proper wound care at home.  Given orthopedic follow-up if needed.  Discussed follow-up and return precautions. Final Clinical Impressions(s) / UC Diagnoses   Final diagnoses:  Sprain of interphalangeal joint of right ring finger, initial encounter  Laceration of left ring finger without foreign body without damage to nail, initial encounter     Discharge Instructions      Your x-ray did not reveal any underlying fracture.  I believe that you have sprained the joint in your ring finger.  We have provided you with a finger splint to help stabilize this to help with healing. You can take 500 to 1000 mg of Tylenol  every 6-8 hours as needed for pain.  Do not exceed 4000 mg in 1 day. Otherwise rest, ice, and elevate periodically throughout the day to help with pain as well.  Keep the lacerations clean and dry.  You can apply mupirocin  ointment twice daily for infection  prevention. You can follow-up with [facility] for further evaluation if your pain continues. Otherwise follow-up with your primary care provider or return here as needed.   ED Prescriptions     Medication Sig Dispense Auth. Provider   mupirocin  ointment (BACTROBAN ) 2 % Apply 1 Application topically 2 (two) times daily. 22 g Johnie Flaming A, NP      PDMP not reviewed this encounter.    [1]  Social History Tobacco Use   Smoking status: Never   Smokeless tobacco: Never  Vaping Use   Vaping status: Never Used  Substance Use Topics   Alcohol use: Yes    Comment: occasional   Drug use: No     Johnie Flaming LABOR, NP 10/28/24 1331  "

## 2024-10-28 NOTE — Discharge Instructions (Signed)
 Your x-ray did not reveal any underlying fracture.  I believe that you have sprained the joint in your ring finger.  We have provided you with a finger splint to help stabilize this to help with healing. You can take 500 to 1000 mg of Tylenol  every 6-8 hours as needed for pain.  Do not exceed 4000 mg in 1 day. Otherwise rest, ice, and elevate periodically throughout the day to help with pain as well.  Keep the lacerations clean and dry.  You can apply mupirocin  ointment twice daily for infection prevention. You can follow-up with [facility] for further evaluation if your pain continues. Otherwise follow-up with your primary care provider or return here as needed.

## 2024-11-15 ENCOUNTER — Institutional Professional Consult (permissible substitution) (INDEPENDENT_AMBULATORY_CARE_PROVIDER_SITE_OTHER): Admitting: Internal Medicine
